# Patient Record
Sex: Male | Born: 1935
Health system: Southern US, Community
[De-identification: ages and names within clinical notes are randomized; demographics above are authoritative.]

## PROBLEM LIST (undated history)

## (undated) DIAGNOSIS — N400 Enlarged prostate without lower urinary tract symptoms: Secondary | ICD-10-CM

## (undated) DIAGNOSIS — I10 Essential (primary) hypertension: Secondary | ICD-10-CM

## (undated) DIAGNOSIS — G629 Polyneuropathy, unspecified: Secondary | ICD-10-CM

## (undated) DIAGNOSIS — K5790 Diverticulosis of intestine, part unspecified, without perforation or abscess without bleeding: Secondary | ICD-10-CM

## (undated) DIAGNOSIS — E785 Hyperlipidemia, unspecified: Secondary | ICD-10-CM

## (undated) DIAGNOSIS — M1990 Unspecified osteoarthritis, unspecified site: Secondary | ICD-10-CM

## (undated) HISTORY — PX: MELANOMA EXCISION: SHX5266

## (undated) HISTORY — PX: OTHER SURGICAL HISTORY: SHX169

## (undated) HISTORY — PX: CARDIAC CATHETERIZATION: SHX172

## (undated) HISTORY — PX: HERNIA REPAIR: SHX51

## (undated) HISTORY — PX: COLON SURGERY: SHX602

## (undated) HISTORY — PX: TONSILLECTOMY: SUR1361

---

## 2004-02-15 ENCOUNTER — Ambulatory Visit: Payer: Self-pay | Admitting: Gastroenterology

## 2004-02-24 ENCOUNTER — Ambulatory Visit: Payer: Self-pay | Admitting: Gastroenterology

## 2004-03-13 ENCOUNTER — Ambulatory Visit: Payer: Self-pay | Admitting: Urology

## 2007-02-21 ENCOUNTER — Emergency Department: Payer: Self-pay | Admitting: Urology

## 2010-11-23 ENCOUNTER — Ambulatory Visit: Payer: Self-pay | Admitting: Neurosurgery

## 2011-06-27 ENCOUNTER — Other Ambulatory Visit: Payer: Self-pay | Admitting: Neurosurgery

## 2011-07-17 ENCOUNTER — Encounter (HOSPITAL_COMMUNITY): Payer: Self-pay | Admitting: Pharmacy Technician

## 2011-07-20 NOTE — Pre-Procedure Instructions (Signed)
20 Mark Cowan  07/20/2011   Your procedure is scheduled on:  July 26, 2011 at 0730 am.  Report to [name] at 0530 AM.  Call this number if you have problems the morning of surgery: [phone]   Remember:   Do not eat food:After Midnight.  May have clear liquids: up to 4 Hours before arrival until 0130 am  Clear liquids include soda, tea, black coffee, apple or grape juice, broth.  Take these medicines the morning of surgery with A SIP OF WATER: Gabapentin (Neurontin)   Do not wear jewelry, make-up or nail polish.  Do not wear lotions, powders, or perfumes. You may wear deodorant.  Do not shave 48 hours prior to surgery.  Do not bring valuables to the hospital.  Contacts, dentures or bridgework may not be worn into surgery.  Leave suitcase in the car. After surgery it may be brought to your room.  For patients admitted to the hospital, checkout time is 11:00 AM the day of discharge.   Patients discharged the day of surgery will not be allowed to drive home.  Name and phone number of your driver:   Special Instructions: CHG Shower Use Special Wash: 1/2 bottle night before surgery and 1/2 bottle morning of surgery.   Please read over the following fact sheets that you were given: Pain Booklet, Coughing and Deep Breathing, MRSA Information and Surgical Site Infection Prevention

## 2011-07-23 ENCOUNTER — Encounter (HOSPITAL_COMMUNITY)
Admission: RE | Admit: 2011-07-23 | Discharge: 2011-07-23 | Disposition: A | Discharge status: Home / Self Care | Payer: Medicare Other | Source: Ambulatory Visit | Attending: Anesthesiology | Admitting: Anesthesiology

## 2011-07-23 ENCOUNTER — Other Ambulatory Visit: Payer: Self-pay

## 2011-07-23 ENCOUNTER — Encounter (HOSPITAL_COMMUNITY)
Admission: RE | Admit: 2011-07-23 | Discharge: 2011-07-23 | Disposition: A | Discharge status: Home / Self Care | Payer: Medicare Other | Source: Ambulatory Visit | Attending: Neurosurgery | Admitting: Neurosurgery

## 2011-07-23 ENCOUNTER — Encounter (HOSPITAL_COMMUNITY): Payer: Self-pay

## 2011-07-23 HISTORY — DX: Benign prostatic hyperplasia without lower urinary tract symptoms: N40.0

## 2011-07-23 HISTORY — DX: Polyneuropathy, unspecified: G62.9

## 2011-07-23 HISTORY — DX: Essential (primary) hypertension: I10

## 2011-07-23 HISTORY — DX: Unspecified osteoarthritis, unspecified site: M19.90

## 2011-07-23 LAB — SURGICAL PCR SCREEN
MRSA, PCR: NEGATIVE
Staphylococcus aureus: NEGATIVE

## 2011-07-23 LAB — BASIC METABOLIC PANEL
GFR calc Af Amer: >90 mL/min (ref >90)
GFR calc non Af Amer: 82 mL/min — ABNORMAL LOW (ref >90)
Glucose, Bld: 104 mg/dL — ABNORMAL HIGH (ref 70–99)
Potassium: 4 mEq/L (ref 3.5–5.1)
Sodium: 137 mEq/L (ref 135–145)

## 2011-07-23 LAB — CBC
Hemoglobin: 14.5 g/dL (ref 13.0–17.0)
MCHC: 34.3 g/dL (ref 30.0–36.0)
Platelets: 209 10*3/uL (ref 150–400)

## 2011-07-25 MED ORDER — CEFAZOLIN SODIUM-DEXTROSE 2-3 GM-% IV SOLR
2.0000 g | INTRAVENOUS | Status: AC
  Administered 2011-07-26: 2 g via INTRAVENOUS
  Filled 2011-07-25: qty 50

## 2011-07-26 ENCOUNTER — Encounter (HOSPITAL_COMMUNITY)
Admission: RE | Disposition: A | Discharge status: Home / Self Care | Payer: Self-pay | Source: Ambulatory Visit | Attending: Neurosurgery

## 2011-07-26 ENCOUNTER — Inpatient Hospital Stay (HOSPITAL_COMMUNITY)
Admission: RE | Admit: 2011-07-26 | Discharge: 2011-07-27 | DRG: 491 | Disposition: A | Discharge status: Home / Self Care | Payer: Medicare Other | Source: Ambulatory Visit | Attending: Neurosurgery | Admitting: Neurosurgery

## 2011-07-26 ENCOUNTER — Ambulatory Visit (HOSPITAL_COMMUNITY): Payer: Medicare Other

## 2011-07-26 ENCOUNTER — Encounter (HOSPITAL_COMMUNITY): Payer: Self-pay | Admitting: Surgery

## 2011-07-26 ENCOUNTER — Encounter (HOSPITAL_COMMUNITY): Payer: Self-pay | Admitting: Anesthesiology

## 2011-07-26 ENCOUNTER — Ambulatory Visit (HOSPITAL_COMMUNITY): Payer: Medicare Other | Admitting: Anesthesiology

## 2011-07-26 DIAGNOSIS — G609 Hereditary and idiopathic neuropathy, unspecified: Secondary | ICD-10-CM | POA: Diagnosis present

## 2011-07-26 DIAGNOSIS — Z85820 Personal history of malignant melanoma of skin: Secondary | ICD-10-CM

## 2011-07-26 DIAGNOSIS — Z79899 Other long term (current) drug therapy: Secondary | ICD-10-CM

## 2011-07-26 DIAGNOSIS — M4120 Other idiopathic scoliosis, site unspecified: Secondary | ICD-10-CM | POA: Diagnosis present

## 2011-07-26 DIAGNOSIS — M129 Arthropathy, unspecified: Secondary | ICD-10-CM | POA: Diagnosis present

## 2011-07-26 DIAGNOSIS — Z7982 Long term (current) use of aspirin: Secondary | ICD-10-CM

## 2011-07-26 DIAGNOSIS — Z87891 Personal history of nicotine dependence: Secondary | ICD-10-CM

## 2011-07-26 DIAGNOSIS — I10 Essential (primary) hypertension: Secondary | ICD-10-CM | POA: Diagnosis present

## 2011-07-26 DIAGNOSIS — M48062 Spinal stenosis, lumbar region with neurogenic claudication: Principal | ICD-10-CM | POA: Diagnosis present

## 2011-07-26 DIAGNOSIS — Z9861 Coronary angioplasty status: Secondary | ICD-10-CM

## 2011-07-26 DIAGNOSIS — IMO0002 Reserved for concepts with insufficient information to code with codable children: Secondary | ICD-10-CM | POA: Diagnosis present

## 2011-07-26 HISTORY — DX: Diverticulosis of intestine, part unspecified, without perforation or abscess without bleeding: K57.90

## 2011-07-26 HISTORY — PX: LUMBAR LAMINECTOMY/DECOMPRESSION MICRODISCECTOMY: SHX5026

## 2011-07-26 SURGERY — LUMBAR LAMINECTOMY/DECOMPRESSION MICRODISCECTOMY 2 LEVELS
Anesthesia: General | Site: Back | Laterality: Bilateral | Wound class: Clean

## 2011-07-26 MED ORDER — FENTANYL CITRATE 0.05 MG/ML IJ SOLN
INTRAMUSCULAR | Status: DC | PRN
  Administered 2011-07-26: 50 ug via INTRAVENOUS
  Administered 2011-07-26: 100 ug via INTRAVENOUS
  Administered 2011-07-26 (×2): 50 ug via INTRAVENOUS

## 2011-07-26 MED ORDER — HYDROMORPHONE HCL PF 1 MG/ML IJ SOLN: 0.2500 mg | INTRAMUSCULAR | Status: DC | PRN

## 2011-07-26 MED ORDER — MORPHINE SULFATE 4 MG/ML IJ SOLN: 1.0000 mg | INTRAMUSCULAR | Status: DC | PRN

## 2011-07-26 MED ORDER — ONDANSETRON HCL 4 MG/2ML IJ SOLN
INTRAMUSCULAR | Status: DC | PRN
  Administered 2011-07-26: 4 mg via INTRAVENOUS

## 2011-07-26 MED ORDER — PROPOFOL 10 MG/ML IV EMUL
INTRAVENOUS | Status: DC | PRN
  Administered 2011-07-26: 150 mg via INTRAVENOUS

## 2011-07-26 MED ORDER — FENOFIBRATE 160 MG PO TABS
160.0000 mg | ORAL_TABLET | Freq: Every day | ORAL | Status: DC
  Administered 2011-07-26: 160 mg via ORAL
  Filled 2011-07-26 (×2): qty 1

## 2011-07-26 MED ORDER — GABAPENTIN 300 MG PO CAPS
300.0000 mg | ORAL_CAPSULE | Freq: Three times a day (TID) | ORAL | Status: DC
  Administered 2011-07-26 – 2011-07-27 (×2): 300 mg via ORAL
  Filled 2011-07-26 (×5): qty 1

## 2011-07-26 MED ORDER — ROCURONIUM BROMIDE 100 MG/10ML IV SOLN
INTRAVENOUS | Status: DC | PRN
  Administered 2011-07-26: 50 mg via INTRAVENOUS

## 2011-07-26 MED ORDER — LOSARTAN POTASSIUM-HCTZ 100-12.5 MG PO TABS
1.0000 | ORAL_TABLET | Freq: Every day | ORAL | Status: DC
Start: 2011-07-26 — End: 2011-07-26

## 2011-07-26 MED ORDER — BACITRACIN 50000 UNITS IM SOLR
INTRAMUSCULAR | Status: AC
  Filled 2011-07-26: qty 1

## 2011-07-26 MED ORDER — MENTHOL 3 MG MT LOZG: 1.0000 | LOZENGE | OROMUCOSAL | Status: DC | PRN

## 2011-07-26 MED ORDER — HEMOSTATIC AGENTS (NO CHARGE) OPTIME
TOPICAL | Status: DC | PRN
  Administered 2011-07-26: 1 via TOPICAL

## 2011-07-26 MED ORDER — PHENOL 1.4 % MT LIQD: 1.0000 | OROMUCOSAL | Status: DC | PRN

## 2011-07-26 MED ORDER — LOSARTAN POTASSIUM 50 MG PO TABS
100.0000 mg | ORAL_TABLET | Freq: Every day | ORAL | Status: DC
  Administered 2011-07-26 – 2011-07-27 (×2): 100 mg via ORAL
  Filled 2011-07-26 (×2): qty 2

## 2011-07-26 MED ORDER — HYDROCHLOROTHIAZIDE 12.5 MG PO CAPS
12.5000 mg | ORAL_CAPSULE | Freq: Every day | ORAL | Status: DC
  Administered 2011-07-26 – 2011-07-27 (×2): 12.5 mg via ORAL
  Filled 2011-07-26 (×2): qty 1

## 2011-07-26 MED ORDER — OXYCODONE-ACETAMINOPHEN 5-325 MG PO TABS
1.0000 | ORAL_TABLET | ORAL | Status: DC | PRN
  Administered 2011-07-27 (×2): 1 via ORAL
  Filled 2011-07-26 (×2): qty 1

## 2011-07-26 MED ORDER — ZOLPIDEM TARTRATE 5 MG PO TABS: 5.0000 mg | ORAL_TABLET | Freq: Every evening | ORAL | Status: DC | PRN

## 2011-07-26 MED ORDER — TAMSULOSIN HCL 0.4 MG PO CAPS
0.4000 mg | ORAL_CAPSULE | Freq: Every day | ORAL | Status: DC
  Administered 2011-07-26: 0.4 mg via ORAL
  Filled 2011-07-26 (×2): qty 1

## 2011-07-26 MED ORDER — BACITRACIN ZINC 500 UNIT/GM EX OINT
TOPICAL_OINTMENT | CUTANEOUS | Status: DC | PRN
  Administered 2011-07-26: 1 via TOPICAL

## 2011-07-26 MED ORDER — DEXAMETHASONE SODIUM PHOSPHATE 4 MG/ML IJ SOLN
INTRAMUSCULAR | Status: DC | PRN
  Administered 2011-07-26: 8 mg via INTRAVENOUS

## 2011-07-26 MED ORDER — 0.9 % SODIUM CHLORIDE (POUR BTL) OPTIME
TOPICAL | Status: DC | PRN
  Administered 2011-07-26: 1000 mL

## 2011-07-26 MED ORDER — ACETAMINOPHEN 650 MG RE SUPP: 650.0000 mg | RECTAL | Status: DC | PRN

## 2011-07-26 MED ORDER — BUPIVACAINE-EPINEPHRINE PF 0.5-1:200000 % IJ SOLN
INTRAMUSCULAR | Status: DC | PRN
  Administered 2011-07-26: 10 mL

## 2011-07-26 MED ORDER — LACTATED RINGERS IV SOLN
INTRAVENOUS | Status: DC | PRN
  Administered 2011-07-26 (×2): via INTRAVENOUS

## 2011-07-26 MED ORDER — ACETAMINOPHEN 325 MG PO TABS
650.0000 mg | ORAL_TABLET | ORAL | Status: DC | PRN
Start: 2011-07-26 — End: 2011-07-27

## 2011-07-26 MED ORDER — DUTASTERIDE 0.5 MG PO CAPS
0.5000 mg | ORAL_CAPSULE | Freq: Every day | ORAL | Status: DC
  Administered 2011-07-26 – 2011-07-27 (×2): 0.5 mg via ORAL
  Filled 2011-07-26 (×2): qty 1

## 2011-07-26 MED ORDER — LIDOCAINE HCL (CARDIAC) 20 MG/ML IV SOLN
INTRAVENOUS | Status: DC | PRN
  Administered 2011-07-26: 100 mg via INTRAVENOUS

## 2011-07-26 MED ORDER — THROMBIN 5000 UNITS EX KIT
PACK | CUTANEOUS | Status: DC | PRN
  Administered 2011-07-26 (×2): 5000 [IU] via TOPICAL

## 2011-07-26 MED ORDER — BUPIVACAINE LIPOSOME 1.3 % IJ SUSP
20.0000 mL | Freq: Once | INTRAMUSCULAR | Status: DC
  Filled 2011-07-26: qty 20

## 2011-07-26 MED ORDER — GLYCOPYRROLATE 0.2 MG/ML IJ SOLN
INTRAMUSCULAR | Status: DC | PRN
  Administered 2011-07-26: .5 mg via INTRAVENOUS

## 2011-07-26 MED ORDER — HYDROCODONE-ACETAMINOPHEN 5-325 MG PO TABS: 1.0000 | ORAL_TABLET | ORAL | Status: DC | PRN

## 2011-07-26 MED ORDER — NEOSTIGMINE METHYLSULFATE 1 MG/ML IJ SOLN
INTRAMUSCULAR | Status: DC | PRN
  Administered 2011-07-26: 4 mg via INTRAVENOUS

## 2011-07-26 MED ORDER — ADULT MULTIVITAMIN W/MINERALS CH
1.0000 | ORAL_TABLET | Freq: Every day | ORAL | Status: DC
  Administered 2011-07-26 – 2011-07-27 (×2): 1 via ORAL
  Filled 2011-07-26 (×2): qty 1

## 2011-07-26 MED ORDER — SUCCINYLCHOLINE CHLORIDE 20 MG/ML IJ SOLN
INTRAMUSCULAR | Status: DC | PRN
  Administered 2011-07-26: 120 mg via INTRAVENOUS

## 2011-07-26 MED ORDER — MIDAZOLAM HCL 5 MG/5ML IJ SOLN
INTRAMUSCULAR | Status: DC | PRN
  Administered 2011-07-26: 2 mg via INTRAVENOUS

## 2011-07-26 MED ORDER — LACTATED RINGERS IV SOLN: INTRAVENOUS | Status: DC

## 2011-07-26 MED ORDER — DOCUSATE SODIUM 100 MG PO CAPS
100.0000 mg | ORAL_CAPSULE | Freq: Two times a day (BID) | ORAL | Status: DC
  Administered 2011-07-26 – 2011-07-27 (×2): 100 mg via ORAL
  Filled 2011-07-26 (×2): qty 1

## 2011-07-26 MED ORDER — SODIUM CHLORIDE 0.9 % IR SOLN
Status: DC | PRN
  Administered 2011-07-26: 08:00:00

## 2011-07-26 MED ORDER — DIAZEPAM 5 MG PO TABS: 5.0000 mg | ORAL_TABLET | Freq: Four times a day (QID) | ORAL | Status: DC | PRN

## 2011-07-26 MED ORDER — CEFAZOLIN SODIUM-DEXTROSE 2-3 GM-% IV SOLR
2.0000 g | Freq: Three times a day (TID) | INTRAVENOUS | Status: AC
  Administered 2011-07-26 (×2): 2 g via INTRAVENOUS
  Filled 2011-07-26 (×2): qty 50

## 2011-07-26 MED ORDER — BUPIVACAINE LIPOSOME 1.3 % IJ SUSP
INTRAMUSCULAR | Status: DC | PRN
  Administered 2011-07-26: 20 mL

## 2011-07-26 MED ORDER — ONDANSETRON HCL 4 MG/2ML IJ SOLN: 4.0000 mg | INTRAMUSCULAR | Status: DC | PRN

## 2011-07-26 MED ORDER — SODIUM CHLORIDE 0.9 % IV SOLN
INTRAVENOUS | Status: AC
  Filled 2011-07-26: qty 500

## 2011-07-26 SURGICAL SUPPLY — 60 items
APL SKNCLS STERI-STRIP NONHPOA (GAUZE/BANDAGES/DRESSINGS) ×1
BAG DECANTER FOR FLEXI CONT (MISCELLANEOUS) ×2 IMPLANT
BENZOIN TINCTURE PRP APPL 2/3 (GAUZE/BANDAGES/DRESSINGS) ×2 IMPLANT
BLADE SURG ROTATE 9660 (MISCELLANEOUS) ×1 IMPLANT
BRUSH SCRUB EZ PLAIN DRY (MISCELLANEOUS) ×2 IMPLANT
BUR ACORN 6.0 (BURR) ×3 IMPLANT
BUR MATCHSTICK NEURO 3.0 LAGG (BURR) ×2 IMPLANT
CANISTER SUCTION 2500CC (MISCELLANEOUS) ×2 IMPLANT
CLOTH BEACON ORANGE TIMEOUT ST (SAFETY) ×2 IMPLANT
CONT SPEC 4OZ CLIKSEAL STRL BL (MISCELLANEOUS) ×2 IMPLANT
DRAPE LAPAROTOMY 100X72X124 (DRAPES) ×2 IMPLANT
DRAPE MICROSCOPE LEICA (MISCELLANEOUS) ×1 IMPLANT
DRAPE MICROSCOPE ZEISS OPMI (DRAPES) ×1 IMPLANT
DRAPE POUCH INSTRU U-SHP 10X18 (DRAPES) ×2 IMPLANT
DRAPE SURG 17X23 STRL (DRAPES) ×8 IMPLANT
ELECT BLADE 4.0 EZ CLEAN MEGAD (MISCELLANEOUS) ×2
ELECT REM PT RETURN 9FT ADLT (ELECTROSURGICAL) ×2
ELECTRODE BLDE 4.0 EZ CLN MEGD (MISCELLANEOUS) ×1 IMPLANT
ELECTRODE REM PT RTRN 9FT ADLT (ELECTROSURGICAL) ×1 IMPLANT
GAUZE SPONGE 4X4 16PLY XRAY LF (GAUZE/BANDAGES/DRESSINGS) ×1 IMPLANT
GLOVE BIO SURGEON STRL SZ8 (GLOVE) ×1 IMPLANT
GLOVE BIO SURGEON STRL SZ8.5 (GLOVE) ×2 IMPLANT
GLOVE BIOGEL PI IND STRL 7.0 (GLOVE) IMPLANT
GLOVE BIOGEL PI INDICATOR 7.0 (GLOVE) ×3
GLOVE ECLIPSE 7.5 STRL STRAW (GLOVE) ×1 IMPLANT
GLOVE EXAM NITRILE LRG STRL (GLOVE) IMPLANT
GLOVE EXAM NITRILE MD LF STRL (GLOVE) ×1 IMPLANT
GLOVE EXAM NITRILE XL STR (GLOVE) IMPLANT
GLOVE EXAM NITRILE XS STR PU (GLOVE) IMPLANT
GLOVE SS BIOGEL STRL SZ 6.5 (GLOVE) IMPLANT
GLOVE SS BIOGEL STRL SZ 8 (GLOVE) ×1 IMPLANT
GLOVE SUPERSENSE BIOGEL SZ 6.5 (GLOVE) ×1
GLOVE SUPERSENSE BIOGEL SZ 8 (GLOVE) ×1
GLOVE SURG SS PI 6.5 STRL IVOR (GLOVE) ×2 IMPLANT
GLOVE SURG SS PI 7.0 STRL IVOR (GLOVE) ×2 IMPLANT
GOWN BRE IMP SLV AUR LG STRL (GOWN DISPOSABLE) ×1 IMPLANT
GOWN BRE IMP SLV AUR XL STRL (GOWN DISPOSABLE) ×5 IMPLANT
GOWN STRL REIN 2XL LVL4 (GOWN DISPOSABLE) IMPLANT
KIT BASIN OR (CUSTOM PROCEDURE TRAY) ×2 IMPLANT
KIT ROOM TURNOVER OR (KITS) ×2 IMPLANT
NDL HYPO 21X1.5 SAFETY (NEEDLE) IMPLANT
NEEDLE HYPO 21X1.5 SAFETY (NEEDLE) ×2 IMPLANT
NEEDLE HYPO 22GX1.5 SAFETY (NEEDLE) ×2 IMPLANT
NS IRRIG 1000ML POUR BTL (IV SOLUTION) ×2 IMPLANT
PACK LAMINECTOMY NEURO (CUSTOM PROCEDURE TRAY) ×2 IMPLANT
PAD ARMBOARD 7.5X6 YLW CONV (MISCELLANEOUS) ×6 IMPLANT
PATTIES SURGICAL .5 X1 (DISPOSABLE) IMPLANT
RUBBERBAND STERILE (MISCELLANEOUS) ×4 IMPLANT
SPONGE GAUZE 4X4 12PLY (GAUZE/BANDAGES/DRESSINGS) ×2 IMPLANT
SPONGE SURGIFOAM ABS GEL SZ50 (HEMOSTASIS) ×2 IMPLANT
STRIP CLOSURE SKIN 1/2X4 (GAUZE/BANDAGES/DRESSINGS) ×2 IMPLANT
SUT VIC AB 1 CT1 18XBRD ANBCTR (SUTURE) ×2 IMPLANT
SUT VIC AB 1 CT1 8-18 (SUTURE) ×4
SUT VIC AB 2-0 CP2 18 (SUTURE) ×4 IMPLANT
SYR 20CC LL (SYRINGE) ×1 IMPLANT
SYR 20ML ECCENTRIC (SYRINGE) ×2 IMPLANT
TAPE CLOTH SURG 4X10 WHT LF (GAUZE/BANDAGES/DRESSINGS) ×1 IMPLANT
TOWEL OR 17X24 6PK STRL BLUE (TOWEL DISPOSABLE) ×2 IMPLANT
TOWEL OR 17X26 10 PK STRL BLUE (TOWEL DISPOSABLE) ×2 IMPLANT
WATER STERILE IRR 1000ML POUR (IV SOLUTION) ×2 IMPLANT

## 2011-07-26 NOTE — Op Note (Signed)
Brief history: The patient is a 78 year old white male who complains of back and leg pain consistent with neurogenic claudication. He has failed medical management worked up with a lumbar MRI. This demonstrated patient has scoliosis, stenosis etc. I discussed the various treatment options with the patient, and surgery. He has weighed the risks, benefits, and alternatives surgery and elected proceed with a decompressive lumbar laminectomy.  Preoperative diagnosis: Lumbar scoliosis, L2-3, L3-4 and L4-5 spinal stenosis, lumbago, lumbar radiculopathy/neurogenic claudication  Postoperative diagnosis: The same  Procedure: L3 and L4 laminectomy with bilateral L2 laminotomies to decompress the bilateral L2, L3, L4 and L5 nerve roots using microdissection  Surgeon: Dr. Delma Officer  Asst.: Dr. Aliene Beams  Anesthesia: Gen. endotracheal  Estimated blood loss: 125 cc  Drains: None  Complications: None  Description of procedure: The patient was brought to the operating room by the anesthesia team. General endotracheal anesthesia was induced. The patient was turned to the prone position on the Wilson frame. The patient's lumbosacral region was then prepared with Betadine scrub and Betadine solution. Sterile drapes were applied.  I then injected the area to be incised with Marcaine with epinephrine solution. I then used a scalpel to make a linear midline incision over the L2-3, L3-4 and L4-5 intervertebral disc space. I then used electrocautery to perform a lateral  subperiosteal dissection exposing the spinous process and lamina of L2-L5. We obtained intraoperative radiograph to confirm our location. I then inserted the [facility] retractor for exposure.  We then brought the operative microscope into the field. Under its magnification and illumination we completed the microdissection. I used a high-speed drill to perform a laminotomy at L2-L4 bilaterally. I then used a Kerrison punches to complete the  laminectomy at L3 and L4 as well as widen the laminotomy at L2 and removed the ligamentum flavum at L2-3, L3-4 and L4-5. We then used microdissection to free up the thecal sac and the L2, L3, L4 and L5 nerve root from the epidural tissue. I then used a Kerrison punch to perform a foraminotomy at about the L2, L3, L4 and L5 nerve root. We then using the nerve root retractor to gently retract the thecal sac and the nerve root medially. This exposed the intervertebral disc. We inspected the intervertebral disc at L2-3, L3-4 and L4-5 bilaterally. There was a bulging but no significant herniations. We did not enter into the intervertebral disc.  I then palpated along the ventral surface of the thecal sac and along exit route of the bilateral L2, L3, L4 and L5 nerve root and noted that the neural structures were well decompressed. This completed the decompression.  We then obtained hemostasis using bipolar electrocautery. We irrigated the wound out with bacitracin solution. We then removed the retractor. We then reapproximated the patient's thoracolumbar fascia with interrupted #1 Vicryl suture. We then reapproximated the patient's subcutaneous tissue with interrupted 3-0 Vicryl suture. We then reapproximated patient's skin with Steri-Strips and benzoin. The was then coated with bacitracin ointment. The drapes were removed. The patient was subsequently returned to the supine position where they were extubated by the anesthesia team. The patient was then transported to the postanesthesia care unit in stable condition. All sponge instrument and needle counts were correct at the end of this case.

## 2011-07-26 NOTE — Anesthesia Procedure Notes (Signed)
Procedure Name: Intubation Date/Time: 07/26/2011 7:45 AM Performed by: Torris House S Pre-anesthesia Checklist: Patient identified, Emergency Drugs available, Suction available, Patient being monitored and Timeout performed Patient Re-evaluated:Patient Re-evaluated prior to inductionOxygen Delivery Method: Circle system utilized Preoxygenation: Pre-oxygenation with 100% oxygen Intubation Type: IV induction Ventilation: Mask ventilation without difficulty Laryngoscope Size: Mac and 4 Grade View: Grade II Tube type: Oral Tube size: 7.5 mm Number of attempts: 2 Airway Equipment and Method: Stylet Placement Confirmation: ETT inserted through vocal cords under direct vision,  positive ETCO2 and breath sounds checked- equal and bilateral Secured at: 23 cm Tube secured with: Tape Dental Injury: Teeth and Oropharynx as per pre-operative assessment

## 2011-07-26 NOTE — Preoperative (Signed)
Beta Blockers   Reason not to administer Beta Blockers:Not Applicable 

## 2011-07-26 NOTE — Transfer of Care (Signed)
Immediate Anesthesia Transfer of Care Note  Patient: Mark Cowan  Procedure(s) Performed: Procedure(s) (LRB): LUMBAR LAMINECTOMY/DECOMPRESSION MICRODISCECTOMY 2 LEVELS (Bilateral)  Patient Location: PACU  Anesthesia Type: General  Level of Consciousness: awake, alert  and oriented  Airway & Oxygen Therapy: Patient Spontanous Breathing and Patient connected to nasal cannula oxygen  Post-op Assessment: Report given to PACU RN and Post -op Vital signs reviewed and stable  Post vital signs: Reviewed and stable  Complications: No apparent anesthesia complications

## 2011-07-26 NOTE — Anesthesia Postprocedure Evaluation (Signed)
  Anesthesia Post-op Note  Patient: Mark Cowan  Procedure(s) Performed: Procedure(s) (LRB): LUMBAR LAMINECTOMY/DECOMPRESSION MICRODISCECTOMY 2 LEVELS (Bilateral)  Patient Location: PACU  Anesthesia Type: General  Level of Consciousness: awake  Airway and Oxygen Therapy: Patient Spontanous Breathing and Patient connected to nasal cannula oxygen  Post-op Pain: none  Post-op Assessment: Post-op Vital signs reviewed, Patient's Cardiovascular Status Stable, Respiratory Function Stable and Patent Airway  Post-op Vital Signs: Reviewed and stable  Complications: No apparent anesthesia complications

## 2011-07-26 NOTE — Progress Notes (Signed)
Subjective:  The patient is alert and pleasant he looks well. He is in no apparent distress.  Objective: Vital signs in last 24 hours: Temp:  [97.2 F (36.2 C)-97.9 F (36.6 C)] 97.2 F (36.2 C) (04/11 1003) Pulse Rate:  [50-71] 51  (04/11 1037) Resp:  [12-18] 14  (04/11 1037) BP: (113-141)/(58-70) 120/70 mmHg (04/11 1034) SpO2:  [92 %-98 %] 95 % (04/11 1037)  Intake/Output from previous day:   Intake/Output this shift: Total I/O In: 1700 [I.V.:1700] Out: 200 [Blood:200]  Physical exam the patient is alert and oriented. His lower extremity motor strength is grossly normal.  Lab Results: No results found for this basename: WBC:2,HGB:2,HCT:2,PLT:2 in the last 72 hours BMET No results found for this basename: NA:2,K:2,CL:2,CO2:2,GLUCOSE:2,BUN:2,CREATININE:2,CALCIUM:2 in the last 72 hours  Studies/Results: Dg Lumbar Spine 1 View  07/26/2011  *RADIOLOGY REPORT*  Clinical Data: L2-L4 laminectomy, decompression.  LUMBAR SPINE - 1 VIEW  Comparison: 10/24/2010  Findings: Single intraoperative image demonstrates posterior surgical instrument directed at the L4-5 level.  IMPRESSION: Intraoperative localization as above.  Original Report Authenticated By: Cyndie Chime, M.D.    Assessment/Plan: The patient is doing well.  LOS: 0 days     Mark Cowan D 07/26/2011, 10:50 AM

## 2011-07-26 NOTE — H&P (Signed)
Subjective: The patient is a 78 year old white male who has complained of back and left leg pain consistent with neurogenic claudication. He has failed medical management and was worked up with a lumbar MRI. This demonstrated the patient has multilevel lumbar spinal stenosis. I discussed the various treatment options with the patient. The patient has decided proceed with a lumbar laminectomy after weighing the risks, benefits and alternatives.   Past Medical History  Diagnosis Date  . Hypertension     dr  Rosanne Ashing   [place]    [place]  . Arthritis   . Prostate enlargement   . Peripheral neuropathy   . Diverticulosis     last attack 04/2011    Past Surgical History  Procedure Date  . Cardiac catheterization     87     stress test   87  . Melanoma excision     lt arm  . Hernia repair   . Colon surgery     part of colon removed,diseased  . Orthrocsopy  lt knee     No Known Allergies  History  Substance Use Topics  . Smoking status: Former Smoker    Quit date: 07/23/1998  . Smokeless tobacco: Not on file  . Alcohol Use: Yes     socially    History reviewed. No pertinent family history. Prior to Admission medications   Medication Sig Start Date End Date Taking? Authorizing Provider  aspirin EC 81 MG tablet Take 81 mg by mouth daily.   Yes Historical Provider, MD  Cholecalciferol (VITAMIN D3) 1000 UNITS CAPS Take 2,000 Units by mouth daily.   Yes Historical Provider, MD  Coenzyme Q10 (CO Q 10) 100 MG CAPS Take 100 mg by mouth daily.   Yes Historical Provider, MD  Docusate Calcium (STOOL SOFTENER PO) Take 100 mg by mouth daily.   Yes Historical Provider, MD  dutasteride (AVODART) 0.5 MG capsule Take 0.5 mg by mouth daily.   Yes Historical Provider, MD  fenofibrate 160 MG tablet Take 160 mg by mouth daily.   Yes Historical Provider, MD  fish oil-omega-3 fatty acids 1000 MG capsule Take 1 g by mouth 2 (two) times daily.   Yes Historical Provider, MD  gabapentin (NEURONTIN) 300 MG  capsule Take 300 mg by mouth 3 (three) times daily.   Yes Historical Provider, MD  Glucosamine-Chondroit-Vit C-Mn (GLUCOSAMINE 1500 COMPLEX PO) Take 1,500 mg by mouth 2 (two) times daily.   Yes Historical Provider, MD  losartan-hydrochlorothiazide (HYZAAR) 100-12.5 MG per tablet Take 1 tablet by mouth daily.   Yes Historical Provider, MD  Multiple Vitamin (MULITIVITAMIN WITH MINERALS) TABS Take 1 tablet by mouth daily.   Yes Historical Provider, MD  Tamsulosin HCl (FLOMAX) 0.4 MG CAPS Take 0.4 mg by mouth daily.   Yes Historical Provider, MD  vitamin C (ASCORBIC ACID) 500 MG tablet Take 500 mg by mouth daily.   Yes Historical Provider, MD     Review of Systems  Positive ROS: As above in the patient has bilateral foot numbness  All other systems have been reviewed and were otherwise negative with the exception of those mentioned in the HPI and as above.  Objective: Vital signs in last 24 hours: Temp:  [97.9 F (36.6 C)] 97.9 F (36.6 C) (04/11 0612) Pulse Rate:  [52] 52  (04/11 0612) Resp:  [18] 18  (04/11 0612) BP: (113)/(65) 113/65 mmHg (04/11 0612) SpO2:  [92 %] 92 % (04/11 0612)  General Appearance: Alert, cooperative, no distress, appears stated age Head:  Normocephalic, without obvious abnormality, atraumatic Eyes: PERRL, conjunctiva/corneas clear, EOM's intact, fundi benign, both eyes      Ears: Normal TM's and external ear canals, both ears Throat: Lips, mucosa, and tongue normal; teeth and gums normal Neck: Supple, symmetrical, trachea midline, no adenopathy; thyroid: No enlargement/tenderness/nodules; no carotid bruit or JVD Back: Symmetric, no curvature, ROM normal, no CVA tenderness Lungs: Clear to auscultation bilaterally, respirations unlabored Heart: Regular rate and rhythm, S1 and S2 normal, no murmur, rub or gallop Abdomen: Soft, non-tender, bowel sounds active all four quadrants, no masses, no organomegaly Extremities: Extremities normal, atraumatic, no cyanosis or  edema Pulses: 2+ and symmetric all extremities Skin: Skin color, texture, turgor normal, no rashes or lesions  NEUROLOGIC:   Mental status: alert and oriented, no aphasia, good attention span, Fund of knowledge/ memory ok Motor Exam - grossly normal Sensory Exam - grossly normal Reflexes:  Coordination - grossly normal Gait - grossly normal Balance - grossly normal Cranial Nerves: I: smell Not tested  II: visual acuity  OS: Normal    OD: Normal   II: visual fields Full to confrontation  II: pupils Equal, round, reactive to light  III,VII: ptosis None  III,IV,VI: extraocular muscles  Full ROM  V: mastication Normal  V: facial light touch sensation  Normal  V,VII: corneal reflex  Present  VII: facial muscle function - upper  Normal  VII: facial muscle function - lower Normal  VIII: hearing Not tested  IX: soft palate elevation  Normal  IX,X: gag reflex Present  XI: trapezius strength  5/5  XI: sternocleidomastoid strength 5/5  XI: neck flexion strength  5/5  XII: tongue strength  Normal    Data Review Lab Results  Component Value Date   WBC 5.4 07/23/2011   HGB 14.5 07/23/2011   HCT 42.3 07/23/2011   MCV 87.0 07/23/2011   PLT 209 07/23/2011   Lab Results  Component Value Date   NA 137 07/23/2011   K 4.0 07/23/2011   CL 102 07/23/2011   CO2 25 07/23/2011   BUN 19 07/23/2011   CREATININE 0.88 07/23/2011   GLUCOSE 104* 07/23/2011   No results found for this basename: INR, PROTIME    Assessment/Plan: L2-3, L3-4 and L4-5 spinal stenosis, lumbago, lumbar radiculopathy/neurogenic claudication: I discussed the situation with the patient and his wife. I have reviewed the MR scan with them and and out the abnormalities. We have discussed the various treatment options including surgery. I described the surgical option of a L3 and 4 laminectomy with bilateral L2 laminotomies. I've shown her surgical models. We have discussed the risks, benefits, alternatives and likelihood of achieving our goals  with surgery. I have answered all his questions . The patient t would like to proceed with the operation.   Miloh Alcocer D 07/26/2011 7:24 AM

## 2011-07-26 NOTE — Anesthesia Preprocedure Evaluation (Addendum)
Anesthesia Evaluation  Patient identified by MRN, date of birth, ID band Patient awake    Reviewed: Allergy & Precautions, H&P , NPO status , Patient's Chart, lab work & pertinent test results  Airway Mallampati: II TM Distance: >3 FB Neck ROM: Full    Dental No notable dental hx. (+) Teeth Intact   Pulmonary neg pulmonary ROS, former smoker breath sounds clear to auscultation  Pulmonary exam normal       Cardiovascular hypertension, On Medications and Pt. on medications Rhythm:Regular Rate:Normal     Neuro/Psych  Neuromuscular disease negative psych ROS   GI/Hepatic negative GI ROS, Neg liver ROS,   Endo/Other  negative endocrine ROS  Renal/GU negative Renal ROS  negative genitourinary   Musculoskeletal   Abdominal   Peds  Hematology negative hematology ROS (+)   Anesthesia Other Findings   Reproductive/Obstetrics negative OB ROS                         Anesthesia Physical Anesthesia Plan  ASA: II  Anesthesia Plan: General   Post-op Pain Management:    Induction: Intravenous  Airway Management Planned: Oral ETT  Additional Equipment:   Intra-op Plan:   Post-operative Plan: Extubation in OR  Informed Consent: I have reviewed the patients History and Physical, chart, labs and discussed the procedure including the risks, benefits and alternatives for the proposed anesthesia with the patient or authorized representative who has indicated his/her understanding and acceptance.   Dental advisory given  Plan Discussed with: CRNA, Anesthesiologist and Surgeon  Anesthesia Plan Comments:        Anesthesia Quick Evaluation

## 2011-07-27 ENCOUNTER — Encounter (HOSPITAL_COMMUNITY): Payer: Self-pay | Admitting: Neurosurgery

## 2011-07-27 MED ORDER — OXYCODONE-ACETAMINOPHEN 5-325 MG PO TABS: 1.0000 | ORAL_TABLET | ORAL | Status: AC | PRN

## 2011-07-27 MED ORDER — DIAZEPAM 5 MG PO TABS: 5.0000 mg | ORAL_TABLET | Freq: Four times a day (QID) | ORAL | Status: AC | PRN

## 2011-07-27 NOTE — Progress Notes (Signed)
OT Discharge Note  Patient is being discharged from OT services secondary to:    Goals met and no further therapy needs identified.  Please see latest Therapy Progress Note for current level of functioning and progress toward goals.  Progress and discharge plan and discussed with patient/caregiver and they    Ot order received this AM   Pt now with d/c OT orders and d/c note in chart   OT to sign off - Pt with no needs    Lucile Shutters   OTR/L Pager: [phone] Office: [phone] .

## 2011-07-27 NOTE — Progress Notes (Signed)
UR COMPLETED  

## 2011-07-27 NOTE — Discharge Summary (Signed)
Physician Discharge Summary  Patient ID: Mark Cowan MRN: [id number] DOB/AGE: [birth year] 78 y.o.  Admit date: 07/26/2011 Discharge date: 07/27/2011  Admission Diagnoses: Lumbar spinal stenosis, neurogenic claudication, lumbago, lumbar radiculopathy, scoliosis  Discharge Diagnoses: Same Active Problems:  * No active hospital problems. *    Discharged Condition: good  Hospital Course: I admitted the patient to [facility] on 07/26/2011. On that day I performed a L2-L4 laminectomy. The surgery went well. The patient's postoperative course was unremarkable and by postop day #1 he was requesting discharge to home. The patient was given oral and written discharge instructions. All his questions were answered.  Consults: None Significant Diagnostic Studies: None Treatments: L2-4 laminectomy Discharge Exam: Blood pressure 110/53, pulse 76, temperature 98.1 F (36.7 C), temperature source Oral, resp. rate 14, SpO2 93.00%. The patient is alert and oriented. His lower strumming motor strength is normal. His dressing is clean and dry.  Disposition: Home  Discharge Orders    Future Orders Please Complete By Expires   Diet - low sodium heart healthy      Increase activity slowly      Discharge instructions      Comments:   Call [phone] for a followup appointment.   Remove dressing in 48 hours      Call MD for:  temperature >100.4      Call MD for:  persistant nausea and vomiting      Call MD for:  severe uncontrolled pain      Call MD for:  redness, tenderness, or signs of infection (pain, swelling, redness, odor or green/yellow discharge around incision site)      Call MD for:  difficulty breathing, headache or visual disturbances      Call MD for:  hives      Call MD for:  persistant dizziness or light-headedness      Call MD for:  extreme fatigue        Medication List  As of 07/27/2011  7:59 AM   TAKE these medications         aspirin EC 81 MG tablet   Take 81  mg by mouth daily.      Co Q 10 100 MG Caps   Take 100 mg by mouth daily.      diazepam 5 MG tablet   Commonly known as: VALIUM   Take 1 tablet (5 mg total) by mouth every 6 (six) hours as needed.      dutasteride 0.5 MG capsule   Commonly known as: AVODART   Take 0.5 mg by mouth daily.      fenofibrate 160 MG tablet   Take 160 mg by mouth daily.      fish oil-omega-3 fatty acids 1000 MG capsule   Take 1 g by mouth 2 (two) times daily.      gabapentin 300 MG capsule   Commonly known as: NEURONTIN   Take 300 mg by mouth 3 (three) times daily.      GLUCOSAMINE 1500 COMPLEX PO   Take 1,500 mg by mouth 2 (two) times daily.      losartan-hydrochlorothiazide 100-12.5 MG per tablet   Commonly known as: HYZAAR   Take 1 tablet by mouth daily.      mulitivitamin with minerals Tabs   Take 1 tablet by mouth daily.      oxyCODONE-acetaminophen 5-325 MG per tablet   Commonly known as: PERCOCET   Take 1-2 tablets by mouth every 4 (four) hours as needed.  STOOL SOFTENER PO   Take 100 mg by mouth daily.      Tamsulosin HCl 0.4 MG Caps   Commonly known as: FLOMAX   Take 0.4 mg by mouth daily.      vitamin C 500 MG tablet   Commonly known as: ASCORBIC ACID   Take 500 mg by mouth daily.      Vitamin D3 1000 UNITS Caps   Take 2,000 Units by mouth daily.             SignedCristi Loron 07/27/2011, 7:59 AM

## 2012-11-03 ENCOUNTER — Ambulatory Visit (INDEPENDENT_AMBULATORY_CARE_PROVIDER_SITE_OTHER): Payer: Medicare Other | Admitting: Nurse Practitioner

## 2012-11-03 ENCOUNTER — Encounter: Payer: Self-pay | Admitting: Nurse Practitioner

## 2012-11-03 ENCOUNTER — Telehealth: Payer: Self-pay | Admitting: Internal Medicine

## 2012-11-03 VITALS — BP 108/66 | HR 68 | Ht 70.0 in | Wt 203.0 lb

## 2012-11-03 DIAGNOSIS — Z1211 Encounter for screening for malignant neoplasm of colon: Secondary | ICD-10-CM

## 2012-11-03 DIAGNOSIS — K5732 Diverticulitis of large intestine without perforation or abscess without bleeding: Secondary | ICD-10-CM

## 2012-11-03 MED ORDER — METRONIDAZOLE 500 MG PO TABS: 500.0000 mg | ORAL_TABLET | Freq: Three times a day (TID) | ORAL | Status: DC

## 2012-11-03 MED ORDER — MOVIPREP 100 G PO SOLR
1.0000 | Freq: Once | ORAL | Status: DC
Start: 2012-11-03 — End: 2012-12-23

## 2012-11-03 MED ORDER — CIPROFLOXACIN HCL 500 MG PO TABS: 500.0000 mg | ORAL_TABLET | Freq: Two times a day (BID) | ORAL | Status: DC

## 2012-11-03 MED ORDER — HYDROCODONE-ACETAMINOPHEN 5-325 MG PO TABS: 1.0000 | ORAL_TABLET | Freq: Four times a day (QID) | ORAL | Status: DC | PRN

## 2012-11-03 NOTE — Telephone Encounter (Signed)
Former patient of Dr. Corinda Gubler. Has not been seen in along time. Per patient's wife hx diverticulitis. Has had abdominal pain and bloating x 1 week. Denies diarrhea, fever , nausea or vomiting. Scheduled with Willette Cluster, NP today at 11:00 AM.

## 2012-11-03 NOTE — Progress Notes (Signed)
HPI :  Patient is a 79 year old male, formerly followed by Dr. Corinda Gubler, here for evaluation of abdominal pain. He gives a history of diverticulitis and is s/p colon resection for diverticultiis in the 1990's. Over the last few years he has had a least one recurrent episode of diverticulitis, otherwise he had been doing well until two months ago when he began  having problems with constipation. Typically patient has a normal daily BM with Metamucil but over the last couple of months he has had to strain to have a BM. Last week he developed left sided abdominal pain which eventually became more localized to mid lower abdomen. He has associated bloating as well. Patient found a few old cipro pills which he tools on Saturday and yesterday morning. While he did improve, symptoms came back Sunday afternoon (yesterday). No fevers. No nausea. No urinary symptoms. No rectal bleeding. His weight is stable.   Past Medical History  Diagnosis Date  . Hypertension     dr  Rosanne Ashing   [place]    [place]  . Arthritis   . Prostate enlargement   . Peripheral neuropathy   . Diverticulosis     last attack 04/2011    Family History  Problem Relation Age of Onset  . Diverticulosis Father     Multiple myloma   History  Substance Use Topics  . Smoking status: Former Smoker    Quit date: 07/23/1998  . Smokeless tobacco: Never Used  . Alcohol Use: Yes     Comment: socially   Current Outpatient Prescriptions  Medication Sig Dispense Refill  . aspirin EC 81 MG tablet Take 81 mg by mouth daily.      . Cholecalciferol (VITAMIN D3) 1000 UNITS CAPS Take 2,000 Units by mouth daily.      . Coenzyme Q10 (CO Q 10) 100 MG CAPS Take 100 mg by mouth daily.      Tery Sanfilippo Calcium (STOOL SOFTENER PO) Take 100 mg by mouth daily.      Marland Kitchen dutasteride (AVODART) 0.5 MG capsule Take 0.5 mg by mouth daily.      . fish oil-omega-3 fatty acids 1000 MG capsule Take 1 g by mouth 2 (two) times daily.      Marland Kitchen gabapentin (NEURONTIN) 300 MG  capsule Take 300 mg by mouth 3 (three) times daily.      . Glucosamine-Chondroit-Vit C-Mn (GLUCOSAMINE 1500 COMPLEX PO) Take 1,500 mg by mouth 2 (two) times daily.      Marland Kitchen losartan-hydrochlorothiazide (HYZAAR) 100-12.5 MG per tablet Take 1 tablet by mouth daily.      . Multiple Vitamin (MULITIVITAMIN WITH MINERALS) TABS Take 1 tablet by mouth daily.      . Tamsulosin HCl (FLOMAX) 0.4 MG CAPS Take 0.4 mg by mouth daily.      . vitamin C (ASCORBIC ACID) 500 MG tablet Take 500 mg by mouth daily.      . ciprofloxacin (CIPRO) 500 MG tablet Take 1 tablet (500 mg total) by mouth 2 (two) times daily.  20 tablet  0  . HYDROcodone-acetaminophen (NORCO/VICODIN) 5-325 MG per tablet Take 1 tablet by mouth every 6 (six) hours as needed for pain.  30 tablet  0  . metroNIDAZOLE (FLAGYL) 500 MG tablet Take 1 tablet (500 mg total) by mouth 3 (three) times daily.  30 tablet  0  . MOVIPREP 100 G SOLR Take 1 kit (100 g total) by mouth once.  1 kit  0   No current facility-administered medications for  this visit.   No Known Allergies   Review of Systems:  Positive for arthritis. All other systems reviewed and negative except where noted in HPI.   Physical Exam: BP 108/66  Pulse 68  Ht 5\' 10"  (1.778 m)  Wt 203 lb (92.08 kg)  BMI 29.13 kg/m2 Constitutional: Pleasant,well-developed, white male in no acute distress. HEENT: Normocephalic and atraumatic. Conjunctivae are normal. No scleral icterus. Neck supple.  Cardiovascular: Normal rate, regular rhythm.  Pulmonary/chest: Effort normal and breath sounds normal. No wheezing, rales or rhonchi. Abdominal: Soft, nondistended, mild left mid abdominal tendereness. Bowel sounds active throughout. There are no masses palpable. No hepatomegaly. Extremities: no edema Lymphadenopathy: No cervical adenopathy noted. Neurological: Alert and oriented to person place and time. Skin: Skin is warm and dry. No rashes noted. Psychiatric: Normal mood and affect. Behavior is  normal.   ASSESSMENT AND PLAN:  83. 79 year old male with lower abdominal pain and history of diverticulitis for which he is status post resection in the 1990s. He is tender in the left mid abdomen, suspect recurrent diverticulitis. Will treat empirically with Cipro and Flagyl. Patient will call after completion of antibiotics if pain has not totally resolved. He will call sooner for worsening pain or fever.  2.  Colon cancer screening. Patient's last colonoscopy was November 2005. He is not do due for a colonoscopy until 2015 but in light of recent bowel changes we will proceed with colonoscopy earlier. Colonoscopy will be scheduled for approximately 3-4 weeks to allow time for resolution of diverticulitis. The risks, benefits, and alternatives to colonoscopy with possible biopsy and possible polypectomy were discussed with the patient and he consents to proceed. Since Dr. Macon Large has retired patient will need a new gastroenterologist in this practice. His wife sees Dr. Juanda Chance and patient prefers to see her as well

## 2012-11-03 NOTE — Patient Instructions (Addendum)
You have been scheduled for a colonoscopy with propofol. Please follow written instructions given to you at your visit today.  Please pick up your prep kit at the pharmacy within the next 1-3 days. If you use inhalers (even only as needed), please bring them with you on the day of your procedure. Your physician has requested that you go to www.startemmi.com and enter the access code given to you at your visit today. This web site gives a general overview about your procedure. However, you should still follow specific instructions given to you by our office regarding your preparation for the procedure. We have sent the following medications to your pharmacy for you to pick up at your convenience: .dottiem Please call after your complete the antibiotics and let us know if you are completely better.  [phone]. CC:  Jerl Mina MD

## 2012-11-04 DIAGNOSIS — K5732 Diverticulitis of large intestine without perforation or abscess without bleeding: Secondary | ICD-10-CM | POA: Insufficient documentation

## 2012-11-04 DIAGNOSIS — Z1211 Encounter for screening for malignant neoplasm of colon: Secondary | ICD-10-CM | POA: Insufficient documentation

## 2012-11-04 NOTE — Progress Notes (Signed)
Reviewed, I will see him.

## 2012-11-17 ENCOUNTER — Encounter: Payer: Self-pay | Admitting: Gastroenterology

## 2012-12-23 ENCOUNTER — Encounter: Payer: Self-pay | Admitting: Internal Medicine

## 2012-12-23 ENCOUNTER — Ambulatory Visit (AMBULATORY_SURGERY_CENTER): Payer: Medicare Other | Admitting: Internal Medicine

## 2012-12-23 VITALS — BP 102/55 | HR 51 | Temp 97.9°F | Resp 26 | Ht 70.0 in | Wt 203.0 lb

## 2012-12-23 DIAGNOSIS — Z1211 Encounter for screening for malignant neoplasm of colon: Secondary | ICD-10-CM

## 2012-12-23 DIAGNOSIS — K5792 Diverticulitis of intestine, part unspecified, without perforation or abscess without bleeding: Secondary | ICD-10-CM

## 2012-12-23 DIAGNOSIS — K5732 Diverticulitis of large intestine without perforation or abscess without bleeding: Secondary | ICD-10-CM

## 2012-12-23 MED ORDER — CIPROFLOXACIN HCL 500 MG PO TABS: 500.0000 mg | ORAL_TABLET | Freq: Two times a day (BID) | ORAL | Status: DC

## 2012-12-23 MED ORDER — SODIUM CHLORIDE 0.9 % IV SOLN: 500.0000 mL | INTRAVENOUS | Status: DC

## 2012-12-23 NOTE — Progress Notes (Signed)
Lidocaine-40mg IV prior to Propofol InductionPropofol given over incremental dosages 

## 2012-12-23 NOTE — Patient Instructions (Addendum)

## 2012-12-23 NOTE — Progress Notes (Signed)
Patient did not have preoperative order for IV antibiotic SSI prophylaxis. (G8918)  Patient did not experience any of the following events: a burn prior to discharge; a fall within the facility; wrong site/side/patient/procedure/implant event; or a hospital transfer or hospital admission upon discharge from the facility. (G8907)  

## 2012-12-23 NOTE — Op Note (Signed)
[facility] [address] [place], [address]   COLONOSCOPY PROCEDURE REPORT  PATIENT: Mark Cowan, Mark Cowan  MR#: [id number] BIRTHDATE: 07-18-1933 , 79  yrs. old GENDER: Male ENDOSCOPIST: Hart Carwin, MD REFERRED XL:KGMWN Hedrick, M.D. PROCEDURE DATE:  12/23/2012 PROCEDURE:   Colonoscopy, screening First Screening Colonoscopy - Avg.  risk and is 50 yrs.  old or older - No.  Prior Negative Screening - Now for repeat screening. Other: See Comments  History of Adenoma - Now for follow-up colonoscopy & has been > or = to 3 yrs.  N/A  Polyps Removed Today? No.  Recommend repeat exam, <10 yrs? No. ASA CLASS:   Class III INDICATIONS:Average risk patient for colon cancer. MEDICATIONS: MAC sedation, administered by CRNA and propofol (Diprivan) 150mg  IV  DESCRIPTION OF PROCEDURE:   After the risks benefits and alternatives of the procedure were thoroughly explained, informed consent was obtained.  A digital rectal exam revealed no abnormalities of the rectum.   The LB [id number]  endoscope was introduced through the anus and advanced to the cecum, which was identified by both the appendix and ileocecal valve. No adverse events experienced.   The quality of the prep was good, using MoviPrep  The instrument was then slowly withdrawn as the colon was fully examined.      COLON FINDINGS: There was moderate diverticulosis noted throughout the entire examined colon with associated muscular hypertrophy. I was unable to locate the anastomosis from  prior sigmoid  resection , there was no stricture. Retroflexed views revealed no abnormalities. The time to cecum=3 minutes 5 seconds.  Withdrawal time=6 minutes 0 seconds.  The scope was withdrawn and the procedure completed.There were small internal hemorrhoids COMPLICATIONS: There were no complications.  ENDOSCOPIC IMPRESSION: There was moderate diverticulosis noted throughout the entire examined  colon  RECOMMENDATIONS: 1.  High fiber diet 2.   Metamucil 1 tsp daily 3  . no recall due to age   eSigned:  Hart Carwin, MD 12/23/2012 2:50 PM   cc:   PATIENT NAME:  Soma, Lizak MR#: [id number]

## 2012-12-24 ENCOUNTER — Telehealth: Payer: Self-pay | Admitting: *Deleted

## 2012-12-24 NOTE — Telephone Encounter (Signed)
  Follow up Call-  Call back number 12/23/2012  Post procedure Call Back phone  # [phone]  Permission to leave phone message Yes   spoke with wife; pt had already left for work  Patient questions:  Do you have a fever, pain , or abdominal swelling? no Pain Score  0 *  Have you tolerated food without any problems? yes  Have you been able to return to your normal activities? yes  Do you have any questions about your discharge instructions: Diet   no Medications  no Follow up visit  no  Do you have questions or concerns about your Care? no  Actions: * If pain score is 4 or above: No action needed, pain <4.

## 2013-11-18 IMAGING — CR DG CHEST 2V
2 series · 2 of 2 positions shown · non-contrast
Comparison: None.

CLINICAL DATA: Preop.

CHEST - 2 VIEW

[view not recorded (1 of 2)]
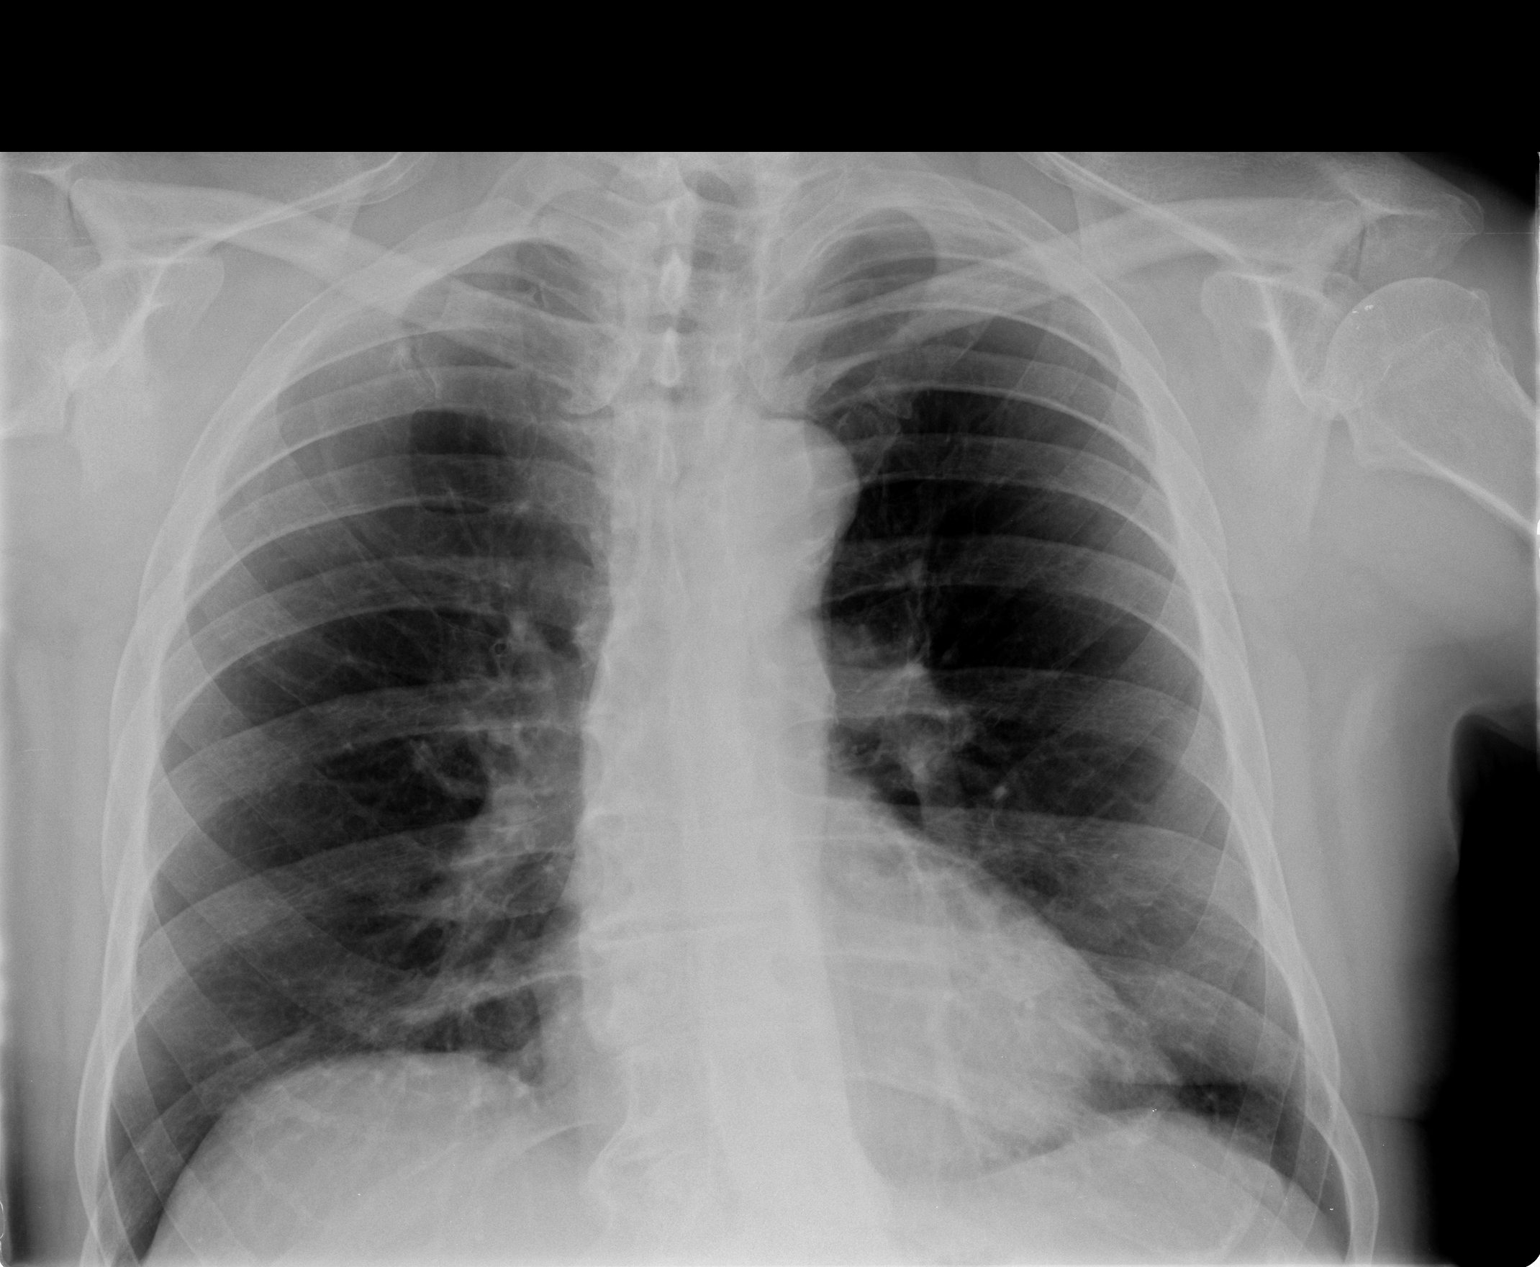

[view not recorded (2 of 2)]
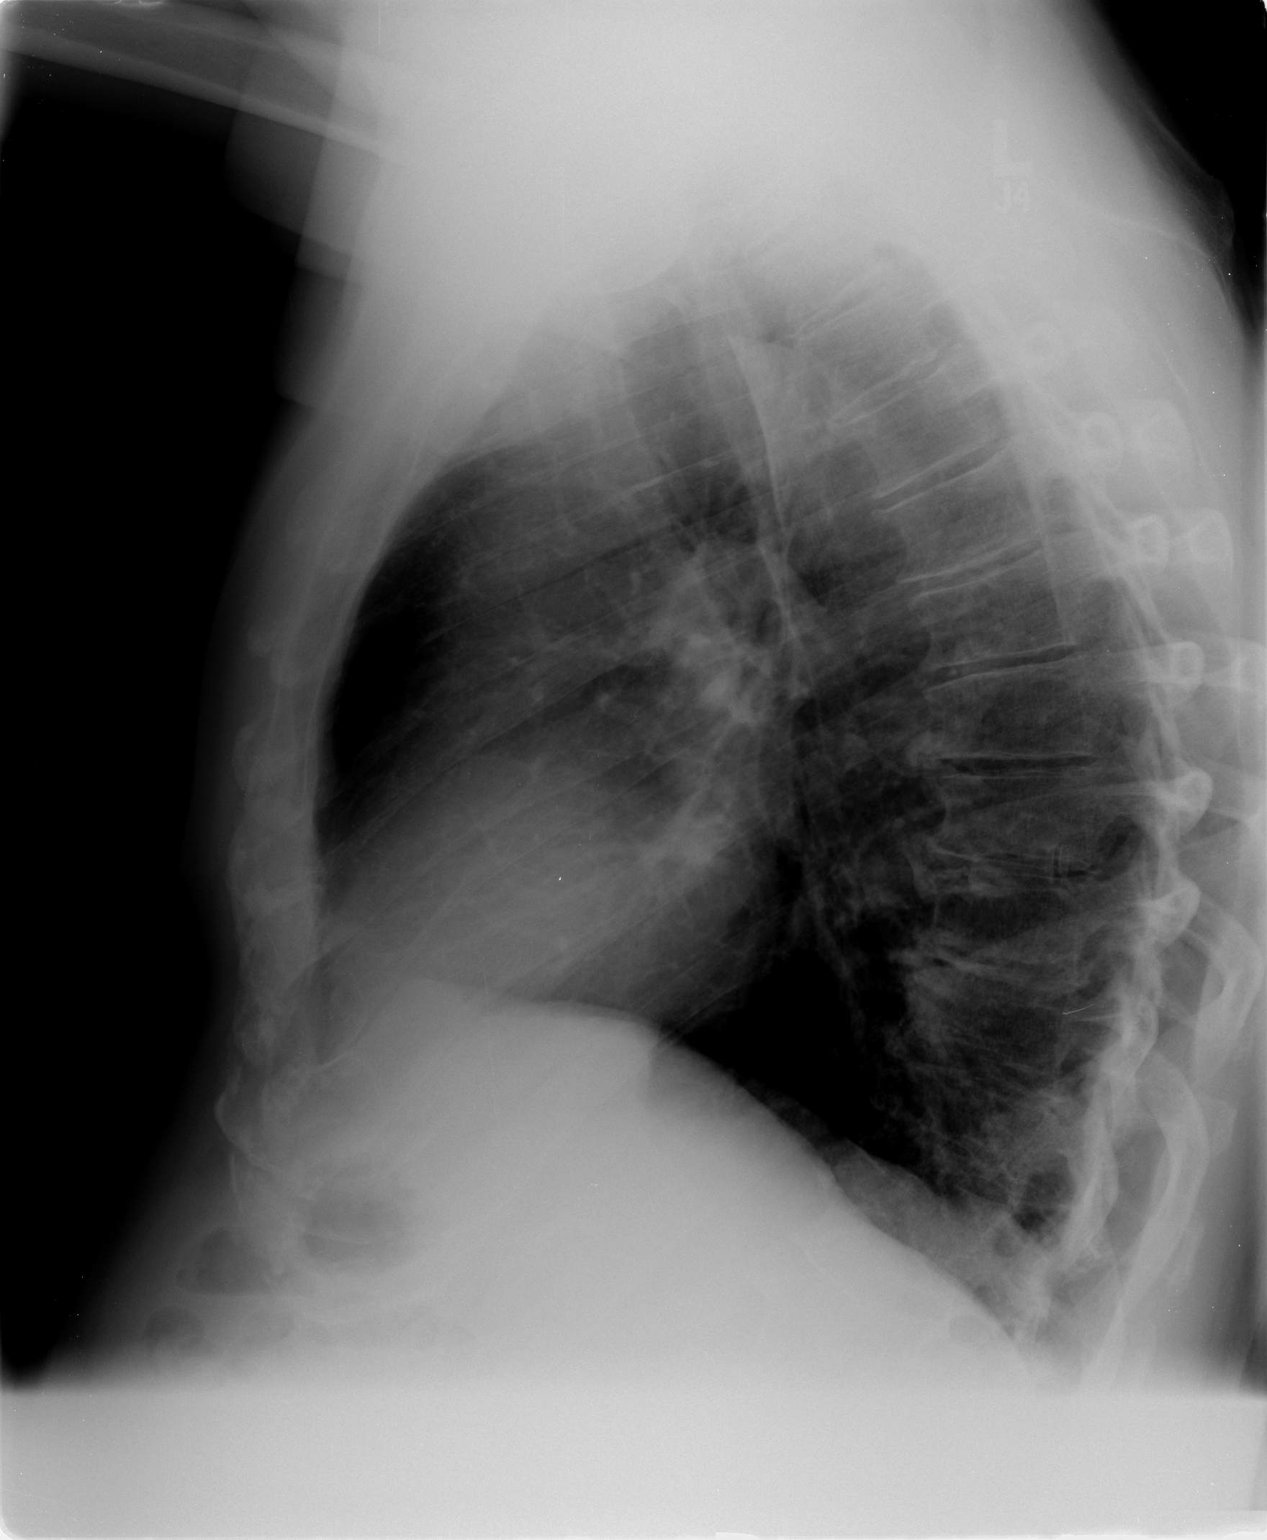

[2 of 2 positions shown; findings below may reference images not displayed]

FINDINGS: Trachea is midline.  Heart size normal.  Lungs are clear.
No pleural fluid.  Degenerative changes are seen in the spine.
IMPRESSION: No acute findings.

## 2014-04-29 ENCOUNTER — Other Ambulatory Visit: Payer: Self-pay | Admitting: Nurse Practitioner

## 2014-04-29 ENCOUNTER — Telehealth: Payer: Self-pay | Admitting: Internal Medicine

## 2014-04-29 MED ORDER — CIPROFLOXACIN HCL 500 MG PO TABS: 500.0000 mg | ORAL_TABLET | Freq: Two times a day (BID) | ORAL | Status: DC

## 2014-04-29 NOTE — Telephone Encounter (Signed)
Having LLQ pain like he has had with diverticulitis Only a few hrs Wants his cipro refilled I did that but told him he should give this some time to see if persists or not as may just be spasm  He related he has used cipro intermittently over the years for similar attacks

## 2015-02-17 ENCOUNTER — Telehealth: Payer: Self-pay | Admitting: Gastroenterology

## 2015-02-17 NOTE — Telephone Encounter (Signed)
His wife called.  He's had abd pain, distention, LLQ pain mainly.  Vomited once.  She says this is like his previous diverticulitis episodes.  No fevers.   I called in cipro 500 twice daily, flagyl 250mg  tid (10 days each) and zofran.  She knows if he worsens to call back or take him to the ER.

## 2015-04-22 DIAGNOSIS — H2513 Age-related nuclear cataract, bilateral: Secondary | ICD-10-CM | POA: Diagnosis not present

## 2015-05-12 DIAGNOSIS — D225 Melanocytic nevi of trunk: Secondary | ICD-10-CM | POA: Diagnosis not present

## 2015-05-12 DIAGNOSIS — D224 Melanocytic nevi of scalp and neck: Secondary | ICD-10-CM | POA: Diagnosis not present

## 2015-05-12 DIAGNOSIS — Z85820 Personal history of malignant melanoma of skin: Secondary | ICD-10-CM | POA: Diagnosis not present

## 2015-05-12 DIAGNOSIS — D2271 Melanocytic nevi of right lower limb, including hip: Secondary | ICD-10-CM | POA: Diagnosis not present

## 2015-05-12 DIAGNOSIS — L821 Other seborrheic keratosis: Secondary | ICD-10-CM | POA: Diagnosis not present

## 2015-05-12 DIAGNOSIS — D1801 Hemangioma of skin and subcutaneous tissue: Secondary | ICD-10-CM | POA: Diagnosis not present

## 2015-05-12 DIAGNOSIS — L570 Actinic keratosis: Secondary | ICD-10-CM | POA: Diagnosis not present

## 2015-05-12 DIAGNOSIS — Z85828 Personal history of other malignant neoplasm of skin: Secondary | ICD-10-CM | POA: Diagnosis not present

## 2015-06-09 DIAGNOSIS — R509 Fever, unspecified: Secondary | ICD-10-CM | POA: Diagnosis not present

## 2015-07-04 DIAGNOSIS — R7309 Other abnormal glucose: Secondary | ICD-10-CM | POA: Diagnosis not present

## 2015-07-04 DIAGNOSIS — R7303 Prediabetes: Secondary | ICD-10-CM | POA: Diagnosis not present

## 2015-07-07 DIAGNOSIS — R739 Hyperglycemia, unspecified: Secondary | ICD-10-CM | POA: Diagnosis not present

## 2015-07-07 DIAGNOSIS — I10 Essential (primary) hypertension: Secondary | ICD-10-CM | POA: Diagnosis not present

## 2015-07-07 DIAGNOSIS — G609 Hereditary and idiopathic neuropathy, unspecified: Secondary | ICD-10-CM | POA: Diagnosis not present

## 2015-07-07 DIAGNOSIS — Z125 Encounter for screening for malignant neoplasm of prostate: Secondary | ICD-10-CM | POA: Diagnosis not present

## 2015-07-07 DIAGNOSIS — E785 Hyperlipidemia, unspecified: Secondary | ICD-10-CM | POA: Diagnosis not present

## 2015-11-22 DIAGNOSIS — I10 Essential (primary) hypertension: Secondary | ICD-10-CM | POA: Diagnosis not present

## 2015-11-22 DIAGNOSIS — R739 Hyperglycemia, unspecified: Secondary | ICD-10-CM | POA: Diagnosis not present

## 2015-11-22 DIAGNOSIS — E785 Hyperlipidemia, unspecified: Secondary | ICD-10-CM | POA: Diagnosis not present

## 2015-11-22 DIAGNOSIS — Z125 Encounter for screening for malignant neoplasm of prostate: Secondary | ICD-10-CM | POA: Diagnosis not present

## 2015-11-29 DIAGNOSIS — Z0000 Encounter for general adult medical examination without abnormal findings: Secondary | ICD-10-CM | POA: Diagnosis not present

## 2016-01-02 DIAGNOSIS — N401 Enlarged prostate with lower urinary tract symptoms: Secondary | ICD-10-CM | POA: Diagnosis not present

## 2016-01-02 DIAGNOSIS — R351 Nocturia: Secondary | ICD-10-CM | POA: Diagnosis not present

## 2016-01-14 DIAGNOSIS — L03115 Cellulitis of right lower limb: Secondary | ICD-10-CM | POA: Diagnosis not present

## 2016-01-14 DIAGNOSIS — S61012A Laceration without foreign body of left thumb without damage to nail, initial encounter: Secondary | ICD-10-CM | POA: Diagnosis not present

## 2016-01-14 DIAGNOSIS — S81801A Unspecified open wound, right lower leg, initial encounter: Secondary | ICD-10-CM | POA: Diagnosis not present

## 2016-01-23 DIAGNOSIS — Z4802 Encounter for removal of sutures: Secondary | ICD-10-CM | POA: Diagnosis not present

## 2016-02-02 DIAGNOSIS — L720 Epidermal cyst: Secondary | ICD-10-CM | POA: Diagnosis not present

## 2016-02-02 DIAGNOSIS — L814 Other melanin hyperpigmentation: Secondary | ICD-10-CM | POA: Diagnosis not present

## 2016-02-02 DIAGNOSIS — Z85820 Personal history of malignant melanoma of skin: Secondary | ICD-10-CM | POA: Diagnosis not present

## 2016-02-02 DIAGNOSIS — L570 Actinic keratosis: Secondary | ICD-10-CM | POA: Diagnosis not present

## 2016-02-02 DIAGNOSIS — Z85828 Personal history of other malignant neoplasm of skin: Secondary | ICD-10-CM | POA: Diagnosis not present

## 2016-02-02 DIAGNOSIS — D1801 Hemangioma of skin and subcutaneous tissue: Secondary | ICD-10-CM | POA: Diagnosis not present

## 2016-04-27 DIAGNOSIS — J069 Acute upper respiratory infection, unspecified: Secondary | ICD-10-CM | POA: Diagnosis not present

## 2016-05-22 DIAGNOSIS — Z85828 Personal history of other malignant neoplasm of skin: Secondary | ICD-10-CM | POA: Diagnosis not present

## 2016-05-22 DIAGNOSIS — Z85820 Personal history of malignant melanoma of skin: Secondary | ICD-10-CM | POA: Diagnosis not present

## 2016-05-22 DIAGNOSIS — L821 Other seborrheic keratosis: Secondary | ICD-10-CM | POA: Diagnosis not present

## 2016-05-22 DIAGNOSIS — D1801 Hemangioma of skin and subcutaneous tissue: Secondary | ICD-10-CM | POA: Diagnosis not present

## 2016-05-22 DIAGNOSIS — D2271 Melanocytic nevi of right lower limb, including hip: Secondary | ICD-10-CM | POA: Diagnosis not present

## 2016-05-22 DIAGNOSIS — L918 Other hypertrophic disorders of the skin: Secondary | ICD-10-CM | POA: Diagnosis not present

## 2016-05-22 DIAGNOSIS — L814 Other melanin hyperpigmentation: Secondary | ICD-10-CM | POA: Diagnosis not present

## 2016-09-14 DIAGNOSIS — M170 Bilateral primary osteoarthritis of knee: Secondary | ICD-10-CM | POA: Diagnosis not present

## 2016-09-14 DIAGNOSIS — M25562 Pain in left knee: Secondary | ICD-10-CM | POA: Diagnosis not present

## 2016-09-14 DIAGNOSIS — M25561 Pain in right knee: Secondary | ICD-10-CM | POA: Diagnosis not present

## 2016-10-05 DIAGNOSIS — M1712 Unilateral primary osteoarthritis, left knee: Secondary | ICD-10-CM | POA: Diagnosis not present

## 2016-10-31 ENCOUNTER — Encounter
Admission: RE | Admit: 2016-10-31 | Discharge: 2016-10-31 | Disposition: A | Discharge status: Home / Self Care | Payer: PPO | Source: Ambulatory Visit | Attending: Orthopedic Surgery | Admitting: Orthopedic Surgery

## 2016-10-31 DIAGNOSIS — I440 Atrioventricular block, first degree: Secondary | ICD-10-CM | POA: Insufficient documentation

## 2016-10-31 DIAGNOSIS — R001 Bradycardia, unspecified: Secondary | ICD-10-CM | POA: Diagnosis not present

## 2016-10-31 DIAGNOSIS — M1712 Unilateral primary osteoarthritis, left knee: Secondary | ICD-10-CM | POA: Diagnosis not present

## 2016-10-31 DIAGNOSIS — Z01818 Encounter for other preprocedural examination: Secondary | ICD-10-CM | POA: Diagnosis not present

## 2016-10-31 DIAGNOSIS — I10 Essential (primary) hypertension: Secondary | ICD-10-CM | POA: Diagnosis not present

## 2016-10-31 HISTORY — DX: Polyneuropathy, unspecified: G62.9

## 2016-10-31 HISTORY — DX: Hyperlipidemia, unspecified: E78.5

## 2016-10-31 LAB — URINALYSIS, ROUTINE W REFLEX MICROSCOPIC
BILIRUBIN URINE: NEGATIVE
GLUCOSE, UA: 50 mg/dL — AB
HGB URINE DIPSTICK: NEGATIVE
Ketones, ur: NEGATIVE mg/dL
Leukocytes, UA: NEGATIVE
NITRITE: NEGATIVE
PH: 5 (ref 5.0–8.0)
Protein, ur: NEGATIVE mg/dL
SPECIFIC GRAVITY, URINE: 1.02 (ref 1.005–1.030)

## 2016-10-31 LAB — COMPREHENSIVE METABOLIC PANEL
ALT: 17 U/L (ref 17–63)
ANION GAP: 9 (ref 5–15)
AST: 28 U/L (ref 15–41)
Albumin: 4.4 g/dL (ref 3.5–5.0)
Alkaline Phosphatase: 37 U/L — ABNORMAL LOW (ref 38–126)
BUN: 20 mg/dL (ref 6–20)
CHLORIDE: 106 mmol/L (ref 101–111)
CO2: 26 mmol/L (ref 22–32)
Calcium: 10 mg/dL (ref 8.9–10.3)
Creatinine, Ser: 0.88 mg/dL (ref 0.61–1.24)
GFR calc Af Amer: >60 mL/min (ref >60)
Glucose, Bld: 125 mg/dL — ABNORMAL HIGH (ref 65–99)
POTASSIUM: 3.6 mmol/L (ref 3.5–5.1)
Sodium: 141 mmol/L (ref 135–145)
Total Bilirubin: 0.8 mg/dL (ref 0.3–1.2)
Total Protein: 7.4 g/dL (ref 6.5–8.1)

## 2016-10-31 LAB — CBC
HCT: 40.9 % (ref 40.0–52.0)
Hemoglobin: 14.2 g/dL (ref 13.0–18.0)
MCH: 30.2 pg (ref 26.0–34.0)
MCHC: 34.8 g/dL (ref 32.0–36.0)
MCV: 86.9 fL (ref 80.0–100.0)
PLATELETS: 237 10*3/uL (ref 150–440)
RBC: 4.71 MIL/uL (ref 4.40–5.90)
RDW: 12.9 % (ref 11.5–14.5)
WBC: 5.3 10*3/uL (ref 3.8–10.6)

## 2016-10-31 LAB — TYPE AND SCREEN
ABO/RH(D): A POS
ANTIBODY SCREEN: NEGATIVE

## 2016-10-31 LAB — C-REACTIVE PROTEIN: CRP: <0.8 mg/dL (ref <1.0)

## 2016-10-31 LAB — SEDIMENTATION RATE: SED RATE: 14 mm/h (ref 0–20)

## 2016-10-31 LAB — APTT: APTT: 30 s (ref 24–36)

## 2016-10-31 LAB — PROTIME-INR
INR: 1.12
PROTHROMBIN TIME: 14.5 s (ref 11.4–15.2)

## 2016-10-31 LAB — SURGICAL PCR SCREEN
MRSA, PCR: NEGATIVE
Staphylococcus aureus: NEGATIVE

## 2016-10-31 NOTE — Patient Instructions (Signed)
Your procedure is scheduled on: 11/12/16 Mon Report to Same Day Surgery 2nd floor medical mall [facility] Entrance-take elevator on left to 2nd floor.  Check in with surgery information desk.) To find out your arrival time please call [phone] between 1PM - 3PM on 11/09/16 Fri  Remember: Instructions that are not followed completely may result in serious medical risk, up to and including death, or upon the discretion of your surgeon and anesthesiologist your surgery may need to be rescheduled.    _x___ 1. Do not eat food or drink liquids after midnight. No gum chewing or                              hard candies.     __x__ 2. No Alcohol for 24 hours before or after surgery.   __x__3. No Smoking for 24 prior to surgery.   ____  4. Bring all medications with you on the day of surgery if instructed.    __x__ 5. Notify your doctor if there is any change in your medical condition     (cold, fever, infections).     Do not wear jewelry, make-up, hairpins, clips or nail polish.  Do not wear lotions, powders, or perfumes. You may wear deodorant.  Do not shave 48 hours prior to surgery. Men may shave face and neck.  Do not bring valuables to the hospital.    [facility] is not responsible for any belongings or valuables.               Contacts, dentures or bridgework may not be worn into surgery.  Leave your suitcase in the car. After surgery it may be brought to your room.  For patients admitted to the hospital, discharge time is determined by your                       treatment team.   Patients discharged the day of surgery will not be allowed to drive home.  You will need someone to drive you home and stay with you the night of your procedure.    Please read over the following fact sheets that you were given:   [facility] Preparing for Surgery and or MRSA Information   _x___ Take anti-hypertensive (unless it includes a diuretic), cardiac, seizure, asthma,     anti-reflux and  psychiatric medicines. These include:  1. amLODipine (NORVASC)  2.  3.  4.  5.  6.  ____Fleets enema or Magnesium Citrate as directed.   _x___ Use CHG Soap or sage wipes as directed on instruction sheet   ____ Use inhalers on the day of surgery and bring to hospital day of surgery  ____ Stop Metformin and Janumet 2 days prior to surgery.    ____ Take 1/2 of usual insulin dose the night before surgery and none on the morning     surgery.   _x___ Follow recommendations from Cardiologist, Pulmonologist or PCP regarding          stopping Aspirin, Coumadin, Pllavix ,Eliquis, Effient, or Pradaxa, and Pletal.  X____Stop Anti-inflammatories such as Advil, Aleve, Ibuprofen, Motrin, Naproxen, Naprosyn, Goodies powders or aspirin products. OK to take Tylenol and                          Celebrex.   _x___ Stop supplements until after surgery.  But may continue Vitamin D, Vitamin  B,       and multivitamin.   ____ Bring C-Pap to the hospital.

## 2016-11-01 LAB — URINE CULTURE
Culture: NO GROWTH
Special Requests: NORMAL

## 2016-11-11 MED ORDER — TRANEXAMIC ACID 1000 MG/10ML IV SOLN
1000.0000 mg | INTRAVENOUS | Status: AC
  Administered 2016-11-12: 1000 mg via INTRAVENOUS
  Filled 2016-11-11: qty 10

## 2016-11-11 MED ORDER — CEFAZOLIN SODIUM-DEXTROSE 2-4 GM/100ML-% IV SOLN
2.0000 g | INTRAVENOUS | Status: AC
  Administered 2016-11-12: 2 g via INTRAVENOUS

## 2016-11-12 ENCOUNTER — Encounter
Admission: RE | Disposition: A | Discharge status: Home / Self Care | Payer: Self-pay | Source: Ambulatory Visit | Attending: Orthopedic Surgery

## 2016-11-12 ENCOUNTER — Inpatient Hospital Stay: Payer: PPO | Admitting: Anesthesiology

## 2016-11-12 ENCOUNTER — Inpatient Hospital Stay
Admission: RE | Admit: 2016-11-12 | Discharge: 2016-11-14 | DRG: 470 | Disposition: A | Discharge status: Home / Self Care | Payer: PPO | Source: Ambulatory Visit | Attending: Orthopedic Surgery | Admitting: Orthopedic Surgery

## 2016-11-12 ENCOUNTER — Inpatient Hospital Stay: Payer: PPO

## 2016-11-12 ENCOUNTER — Encounter: Payer: Self-pay | Admitting: *Deleted

## 2016-11-12 DIAGNOSIS — Z96652 Presence of left artificial knee joint: Secondary | ICD-10-CM | POA: Diagnosis not present

## 2016-11-12 DIAGNOSIS — Z96659 Presence of unspecified artificial knee joint: Secondary | ICD-10-CM

## 2016-11-12 DIAGNOSIS — Z87891 Personal history of nicotine dependence: Secondary | ICD-10-CM | POA: Diagnosis not present

## 2016-11-12 DIAGNOSIS — Z85820 Personal history of malignant melanoma of skin: Secondary | ICD-10-CM | POA: Diagnosis not present

## 2016-11-12 DIAGNOSIS — N400 Enlarged prostate without lower urinary tract symptoms: Secondary | ICD-10-CM | POA: Diagnosis not present

## 2016-11-12 DIAGNOSIS — M1712 Unilateral primary osteoarthritis, left knee: Principal | ICD-10-CM | POA: Diagnosis present

## 2016-11-12 DIAGNOSIS — I10 Essential (primary) hypertension: Secondary | ICD-10-CM | POA: Diagnosis not present

## 2016-11-12 DIAGNOSIS — G629 Polyneuropathy, unspecified: Secondary | ICD-10-CM | POA: Diagnosis not present

## 2016-11-12 DIAGNOSIS — K5730 Diverticulosis of large intestine without perforation or abscess without bleeding: Secondary | ICD-10-CM | POA: Diagnosis not present

## 2016-11-12 DIAGNOSIS — Z471 Aftercare following joint replacement surgery: Secondary | ICD-10-CM | POA: Diagnosis not present

## 2016-11-12 HISTORY — PX: KNEE ARTHROPLASTY: SHX992

## 2016-11-12 LAB — ABO/RH: ABO/RH(D): A POS

## 2016-11-12 SURGERY — ARTHROPLASTY, KNEE, TOTAL, USING IMAGELESS COMPUTER-ASSISTED NAVIGATION
Anesthesia: Spinal | Site: Knee | Laterality: Left | Wound class: Clean

## 2016-11-12 MED ORDER — ACETAMINOPHEN 10 MG/ML IV SOLN
1000.0000 mg | Freq: Four times a day (QID) | INTRAVENOUS | Status: AC
  Administered 2016-11-12 – 2016-11-13 (×3): 1000 mg via INTRAVENOUS
  Filled 2016-11-12 (×5): qty 100

## 2016-11-12 MED ORDER — FENTANYL CITRATE (PF) 100 MCG/2ML IJ SOLN
25.0000 ug | INTRAMUSCULAR | Status: DC | PRN
  Administered 2016-11-12 (×4): 25 ug via INTRAVENOUS

## 2016-11-12 MED ORDER — PROPOFOL 10 MG/ML IV BOLUS
INTRAVENOUS | Status: AC
  Filled 2016-11-12: qty 20

## 2016-11-12 MED ORDER — LIDOCAINE HCL (PF) 2 % IJ SOLN
INTRAMUSCULAR | Status: AC
  Filled 2016-11-12: qty 2

## 2016-11-12 MED ORDER — OXYCODONE HCL 5 MG PO TABS
5.0000 mg | ORAL_TABLET | ORAL | Status: DC | PRN
  Administered 2016-11-12 (×2): 10 mg via ORAL
  Administered 2016-11-13: 5 mg via ORAL
  Administered 2016-11-13 (×2): 10 mg via ORAL
  Filled 2016-11-12 (×3): qty 2
  Filled 2016-11-12: qty 1
  Filled 2016-11-12: qty 2

## 2016-11-12 MED ORDER — TETRACAINE HCL 1 % IJ SOLN
INTRAMUSCULAR | Status: DC | PRN
  Administered 2016-11-12: 8 mg via INTRASPINAL

## 2016-11-12 MED ORDER — DUTASTERIDE 0.5 MG PO CAPS
0.5000 mg | ORAL_CAPSULE | Freq: Every day | ORAL | Status: DC
  Administered 2016-11-12 – 2016-11-14 (×3): 0.5 mg via ORAL
  Filled 2016-11-12 (×3): qty 1

## 2016-11-12 MED ORDER — FERROUS SULFATE 325 (65 FE) MG PO TABS
325.0000 mg | ORAL_TABLET | Freq: Two times a day (BID) | ORAL | Status: DC
  Administered 2016-11-12 – 2016-11-14 (×4): 325 mg via ORAL
  Filled 2016-11-12 (×4): qty 1

## 2016-11-12 MED ORDER — OMEGA-3-ACID ETHYL ESTERS 1 G PO CAPS
1.0000 g | ORAL_CAPSULE | Freq: Two times a day (BID) | ORAL | Status: DC
  Administered 2016-11-12 – 2016-11-14 (×4): 1 g via ORAL
  Filled 2016-11-12 (×8): qty 1

## 2016-11-12 MED ORDER — NEOMYCIN-POLYMYXIN B GU 40-200000 IR SOLN
Status: DC | PRN
  Administered 2016-11-12: 14 mL

## 2016-11-12 MED ORDER — PROPOFOL 10 MG/ML IV BOLUS
INTRAVENOUS | Status: DC | PRN
  Administered 2016-11-12 (×2): 20 mg via INTRAVENOUS

## 2016-11-12 MED ORDER — ACETAMINOPHEN 10 MG/ML IV SOLN
INTRAVENOUS | Status: AC
Start: 2016-11-12 — End: 2016-11-12
  Filled 2016-11-12: qty 100

## 2016-11-12 MED ORDER — BUPIVACAINE LIPOSOME 1.3 % IJ SUSP
INTRAMUSCULAR | Status: AC
  Filled 2016-11-12: qty 20

## 2016-11-12 MED ORDER — MENTHOL 3 MG MT LOZG
1.0000 | LOZENGE | OROMUCOSAL | Status: DC | PRN
  Filled 2016-11-12: qty 9

## 2016-11-12 MED ORDER — MIDAZOLAM HCL 2 MG/2ML IJ SOLN
INTRAMUSCULAR | Status: AC
  Filled 2016-11-12: qty 2

## 2016-11-12 MED ORDER — SODIUM CHLORIDE 0.9 % IV SOLN
INTRAVENOUS | Status: DC | PRN
  Administered 2016-11-12: 60 mL

## 2016-11-12 MED ORDER — ROSUVASTATIN CALCIUM 10 MG PO TABS
10.0000 mg | ORAL_TABLET | ORAL | Status: DC
  Administered 2016-11-13 (×2): 10 mg via ORAL
  Filled 2016-11-12 (×2): qty 1

## 2016-11-12 MED ORDER — MAGNESIUM HYDROXIDE 400 MG/5ML PO SUSP
30.0000 mL | Freq: Every day | ORAL | Status: DC | PRN
  Administered 2016-11-13 – 2016-11-14 (×2): 30 mL via ORAL
  Filled 2016-11-12 (×2): qty 30

## 2016-11-12 MED ORDER — CELECOXIB 200 MG PO CAPS
200.0000 mg | ORAL_CAPSULE | Freq: Two times a day (BID) | ORAL | Status: DC
  Administered 2016-11-12 – 2016-11-14 (×5): 200 mg via ORAL
  Filled 2016-11-12 (×5): qty 1

## 2016-11-12 MED ORDER — PROPOFOL 500 MG/50ML IV EMUL
INTRAVENOUS | Status: DC | PRN
  Administered 2016-11-12: 25 ug/kg/min via INTRAVENOUS

## 2016-11-12 MED ORDER — ONDANSETRON HCL 4 MG PO TABS: 4.0000 mg | ORAL_TABLET | Freq: Four times a day (QID) | ORAL | Status: DC | PRN

## 2016-11-12 MED ORDER — DIPHENHYDRAMINE HCL 12.5 MG/5ML PO ELIX: 12.5000 mg | ORAL_SOLUTION | ORAL | Status: DC | PRN

## 2016-11-12 MED ORDER — LOSARTAN POTASSIUM 50 MG PO TABS
100.0000 mg | ORAL_TABLET | Freq: Every day | ORAL | Status: DC
  Administered 2016-11-13 – 2016-11-14 (×2): 100 mg via ORAL
  Filled 2016-11-12 (×2): qty 2

## 2016-11-12 MED ORDER — PHENYLEPHRINE HCL 10 MG/ML IJ SOLN
INTRAMUSCULAR | Status: AC
  Filled 2016-11-12: qty 1

## 2016-11-12 MED ORDER — LIDOCAINE HCL 2 % EX GEL
CUTANEOUS | Status: AC
Start: 2016-11-12 — End: 2016-11-12
  Filled 2016-11-12: qty 5

## 2016-11-12 MED ORDER — ACETAMINOPHEN 650 MG RE SUPP: 650.0000 mg | Freq: Four times a day (QID) | RECTAL | Status: DC | PRN

## 2016-11-12 MED ORDER — PROPOFOL 500 MG/50ML IV EMUL
INTRAVENOUS | Status: AC
  Filled 2016-11-12: qty 50

## 2016-11-12 MED ORDER — VITAMIN D 1000 UNITS PO TABS
2000.0000 [IU] | ORAL_TABLET | Freq: Every day | ORAL | Status: DC
  Administered 2016-11-13 – 2016-11-14 (×2): 2000 [IU] via ORAL
  Filled 2016-11-12 (×2): qty 2

## 2016-11-12 MED ORDER — ONDANSETRON HCL 4 MG/2ML IJ SOLN: 4.0000 mg | Freq: Once | INTRAMUSCULAR | Status: DC | PRN

## 2016-11-12 MED ORDER — VITAMIN B-12 1000 MCG PO TABS
2500.0000 ug | ORAL_TABLET | Freq: Every day | ORAL | Status: DC
  Administered 2016-11-13 – 2016-11-14 (×2): 2500 ug via ORAL
  Filled 2016-11-12: qty 3
  Filled 2016-11-12: qty 2.5
  Filled 2016-11-12: qty 3
  Filled 2016-11-12: qty 2.5

## 2016-11-12 MED ORDER — BUPIVACAINE HCL (PF) 0.5 % IJ SOLN
INTRAMUSCULAR | Status: DC | PRN
  Administered 2016-11-12: 2 mL via INTRATHECAL

## 2016-11-12 MED ORDER — BISACODYL 10 MG RE SUPP: 10.0000 mg | Freq: Every day | RECTAL | Status: DC | PRN

## 2016-11-12 MED ORDER — VITAMIN C 500 MG PO TABS
500.0000 mg | ORAL_TABLET | Freq: Every day | ORAL | Status: DC
  Administered 2016-11-13 – 2016-11-14 (×2): 500 mg via ORAL
  Filled 2016-11-12 (×3): qty 1

## 2016-11-12 MED ORDER — LOSARTAN POTASSIUM-HCTZ 100-25 MG PO TABS: 1.0000 | ORAL_TABLET | Freq: Every day | ORAL | Status: DC

## 2016-11-12 MED ORDER — TETRACAINE HCL 1 % IJ SOLN
INTRAMUSCULAR | Status: AC
  Filled 2016-11-12: qty 2

## 2016-11-12 MED ORDER — GLYCOPYRROLATE 0.2 MG/ML IJ SOLN
0.4000 mg | Freq: Once | INTRAMUSCULAR | Status: AC
  Administered 2016-11-12: 0.4 mg via INTRAVENOUS

## 2016-11-12 MED ORDER — GABAPENTIN 300 MG PO CAPS
300.0000 mg | ORAL_CAPSULE | Freq: Every evening | ORAL | Status: DC
  Administered 2016-11-12 – 2016-11-13 (×2): 300 mg via ORAL
  Filled 2016-11-12 (×2): qty 1

## 2016-11-12 MED ORDER — SODIUM CHLORIDE 0.9 % IV SOLN
INTRAVENOUS | Status: DC
  Administered 2016-11-12: 12:00:00 via INTRAVENOUS

## 2016-11-12 MED ORDER — FAMOTIDINE 20 MG PO TABS
ORAL_TABLET | ORAL | Status: AC
  Administered 2016-11-12: 20 mg via ORAL
  Filled 2016-11-12: qty 1

## 2016-11-12 MED ORDER — ENOXAPARIN SODIUM 30 MG/0.3ML ~~LOC~~ SOLN
30.0000 mg | Freq: Two times a day (BID) | SUBCUTANEOUS | Status: DC
  Administered 2016-11-13 – 2016-11-14 (×3): 30 mg via SUBCUTANEOUS
  Filled 2016-11-12 (×3): qty 0.3

## 2016-11-12 MED ORDER — TRAMADOL HCL 50 MG PO TABS
50.0000 mg | ORAL_TABLET | ORAL | Status: DC | PRN
  Administered 2016-11-12 – 2016-11-14 (×4): 100 mg via ORAL
  Filled 2016-11-12 (×4): qty 2

## 2016-11-12 MED ORDER — MIDAZOLAM HCL 5 MG/5ML IJ SOLN
INTRAMUSCULAR | Status: DC | PRN
  Administered 2016-11-12 (×4): 1 mg via INTRAVENOUS

## 2016-11-12 MED ORDER — LACTATED RINGERS IV SOLN
INTRAVENOUS | Status: DC
  Administered 2016-11-12 (×2): via INTRAVENOUS

## 2016-11-12 MED ORDER — ACETAMINOPHEN 10 MG/ML IV SOLN
INTRAVENOUS | Status: DC | PRN
  Administered 2016-11-12: 1000 mg via INTRAVENOUS

## 2016-11-12 MED ORDER — ACETAMINOPHEN 325 MG PO TABS: 650.0000 mg | ORAL_TABLET | Freq: Four times a day (QID) | ORAL | Status: DC | PRN

## 2016-11-12 MED ORDER — SENNOSIDES-DOCUSATE SODIUM 8.6-50 MG PO TABS
1.0000 | ORAL_TABLET | Freq: Two times a day (BID) | ORAL | Status: DC
  Administered 2016-11-12 – 2016-11-14 (×5): 1 via ORAL
  Filled 2016-11-12 (×5): qty 1

## 2016-11-12 MED ORDER — METOCLOPRAMIDE HCL 10 MG PO TABS
10.0000 mg | ORAL_TABLET | Freq: Three times a day (TID) | ORAL | Status: AC
  Administered 2016-11-12 – 2016-11-14 (×8): 10 mg via ORAL
  Filled 2016-11-12 (×8): qty 1

## 2016-11-12 MED ORDER — GLYCOPYRROLATE 0.2 MG/ML IJ SOLN
INTRAMUSCULAR | Status: AC
  Filled 2016-11-12: qty 1

## 2016-11-12 MED ORDER — CHLORHEXIDINE GLUCONATE 4 % EX LIQD: 60.0000 mL | Freq: Once | CUTANEOUS | Status: DC

## 2016-11-12 MED ORDER — PANTOPRAZOLE SODIUM 40 MG PO TBEC
40.0000 mg | DELAYED_RELEASE_TABLET | Freq: Two times a day (BID) | ORAL | Status: DC
  Administered 2016-11-12 – 2016-11-14 (×5): 40 mg via ORAL
  Filled 2016-11-12 (×5): qty 1

## 2016-11-12 MED ORDER — CEFAZOLIN SODIUM-DEXTROSE 2-4 GM/100ML-% IV SOLN
INTRAVENOUS | Status: AC
  Filled 2016-11-12: qty 100

## 2016-11-12 MED ORDER — BUPIVACAINE HCL (PF) 0.5 % IJ SOLN
INTRAMUSCULAR | Status: AC
  Filled 2016-11-12: qty 10

## 2016-11-12 MED ORDER — SODIUM CHLORIDE 0.9 % IJ SOLN
INTRAMUSCULAR | Status: AC
  Filled 2016-11-12: qty 50

## 2016-11-12 MED ORDER — FENTANYL CITRATE (PF) 100 MCG/2ML IJ SOLN
INTRAMUSCULAR | Status: AC
  Filled 2016-11-12: qty 2

## 2016-11-12 MED ORDER — TAMSULOSIN HCL 0.4 MG PO CAPS
0.4000 mg | ORAL_CAPSULE | Freq: Every evening | ORAL | Status: DC
  Administered 2016-11-12 – 2016-11-13 (×2): 0.4 mg via ORAL
  Filled 2016-11-12 (×2): qty 1

## 2016-11-12 MED ORDER — AMLODIPINE BESYLATE 5 MG PO TABS
2.5000 mg | ORAL_TABLET | Freq: Every day | ORAL | Status: DC
  Administered 2016-11-13 – 2016-11-14 (×2): 2.5 mg via ORAL
  Filled 2016-11-12 (×2): qty 1

## 2016-11-12 MED ORDER — FENOFIBRATE 160 MG PO TABS
160.0000 mg | ORAL_TABLET | Freq: Every evening | ORAL | Status: DC
  Administered 2016-11-12 – 2016-11-13 (×2): 160 mg via ORAL
  Filled 2016-11-12 (×3): qty 1

## 2016-11-12 MED ORDER — PHENOL 1.4 % MT LIQD
1.0000 | OROMUCOSAL | Status: DC | PRN
  Filled 2016-11-12: qty 177

## 2016-11-12 MED ORDER — HYDROCHLOROTHIAZIDE 25 MG PO TABS
25.0000 mg | ORAL_TABLET | Freq: Every day | ORAL | Status: DC
  Administered 2016-11-13 – 2016-11-14 (×2): 25 mg via ORAL
  Filled 2016-11-12 (×3): qty 1

## 2016-11-12 MED ORDER — GLYCOPYRROLATE 0.2 MG/ML IJ SOLN
INTRAMUSCULAR | Status: DC | PRN
  Administered 2016-11-12: 0.2 mg via INTRAVENOUS

## 2016-11-12 MED ORDER — CEFAZOLIN SODIUM-DEXTROSE 2-4 GM/100ML-% IV SOLN
2.0000 g | Freq: Four times a day (QID) | INTRAVENOUS | Status: AC
  Administered 2016-11-12 – 2016-11-13 (×3): 2 g via INTRAVENOUS
  Filled 2016-11-12 (×5): qty 100

## 2016-11-12 MED ORDER — EPHEDRINE SULFATE 50 MG/ML IJ SOLN
INTRAMUSCULAR | Status: AC
  Filled 2016-11-12: qty 1

## 2016-11-12 MED ORDER — FAMOTIDINE 20 MG PO TABS
20.0000 mg | ORAL_TABLET | Freq: Once | ORAL | Status: AC
  Administered 2016-11-12: 20 mg via ORAL

## 2016-11-12 MED ORDER — ALUM & MAG HYDROXIDE-SIMETH 200-200-20 MG/5ML PO SUSP: 30.0000 mL | ORAL | Status: DC | PRN

## 2016-11-12 MED ORDER — NEOMYCIN-POLYMYXIN B GU 40-200000 IR SOLN
Status: AC
  Filled 2016-11-12: qty 20

## 2016-11-12 MED ORDER — FLEET ENEMA 7-19 GM/118ML RE ENEM: 1.0000 | ENEMA | Freq: Once | RECTAL | Status: DC | PRN

## 2016-11-12 MED ORDER — EPHEDRINE SULFATE 50 MG/ML IJ SOLN
INTRAMUSCULAR | Status: DC | PRN
  Administered 2016-11-12: 10 mg via INTRAVENOUS

## 2016-11-12 MED ORDER — LIDOCAINE HCL (PF) 2 % IJ SOLN
INTRAMUSCULAR | Status: DC | PRN
  Administered 2016-11-12: 50 mg

## 2016-11-12 MED ORDER — LIDOCAINE HCL 2 % EX GEL
CUTANEOUS | Status: DC | PRN
  Administered 2016-11-12: 1 via TOPICAL

## 2016-11-12 MED ORDER — BUPIVACAINE HCL (PF) 0.25 % IJ SOLN
INTRAMUSCULAR | Status: DC | PRN
  Administered 2016-11-12: 60 mL

## 2016-11-12 MED ORDER — MORPHINE SULFATE (PF) 2 MG/ML IV SOLN
2.0000 mg | INTRAVENOUS | Status: DC | PRN
Start: 2016-11-12 — End: 2016-11-14

## 2016-11-12 MED ORDER — ONDANSETRON HCL 4 MG/2ML IJ SOLN: 4.0000 mg | Freq: Four times a day (QID) | INTRAMUSCULAR | Status: DC | PRN

## 2016-11-12 MED ORDER — TRANEXAMIC ACID 1000 MG/10ML IV SOLN
1000.0000 mg | Freq: Once | INTRAVENOUS | Status: AC
  Administered 2016-11-12: 1000 mg via INTRAVENOUS
  Filled 2016-11-12: qty 10

## 2016-11-12 MED ORDER — BUPIVACAINE HCL (PF) 0.25 % IJ SOLN
INTRAMUSCULAR | Status: AC
  Filled 2016-11-12: qty 60

## 2016-11-12 SURGICAL SUPPLY — 71 items
BATTERY INSTRU NAVIGATION (MISCELLANEOUS) ×12 IMPLANT
BLADE CLIPPER SURG (BLADE) ×2 IMPLANT
BLADE SAW 1 (BLADE) ×3 IMPLANT
BLADE SAW 1/2 (BLADE) ×3 IMPLANT
BLADE SAW 70X12.5 (BLADE) IMPLANT
BNDG COHESIVE 4X5 TAN STRL (GAUZE/BANDAGES/DRESSINGS) ×2 IMPLANT
BTRY SRG DRVR LF (MISCELLANEOUS) ×4
CANISTER SUCT 1200ML W/VALVE (MISCELLANEOUS) ×3 IMPLANT
CANISTER SUCT 3000ML PPV (MISCELLANEOUS) ×6 IMPLANT
CAPT KNEE TOTAL 3 ATTUNE ×2 IMPLANT
CATH COUDE FOLEY 5CC 14FR (CATHETERS) ×2 IMPLANT
CATH TRAY METER 16FR LF (MISCELLANEOUS) ×3 IMPLANT
CEMENT HV SMART SET (Cement) ×6 IMPLANT
COOLER POLAR GLACIER W/PUMP (MISCELLANEOUS) ×3 IMPLANT
CUFF TOURN 24 STER (MISCELLANEOUS) IMPLANT
CUFF TOURN 30 STER DUAL PORT (MISCELLANEOUS) ×2 IMPLANT
DRAPE SHEET LG 3/4 BI-LAMINATE (DRAPES) ×3 IMPLANT
DRSG DERMACEA 8X12 NADH (GAUZE/BANDAGES/DRESSINGS) ×3 IMPLANT
DRSG OPSITE POSTOP 4X14 (GAUZE/BANDAGES/DRESSINGS) ×3 IMPLANT
DRSG TEGADERM 4X4.75 (GAUZE/BANDAGES/DRESSINGS) ×3 IMPLANT
DURAPREP 26ML APPLICATOR (WOUND CARE) ×4 IMPLANT
ELECT CAUTERY BLADE 6.4 (BLADE) ×3 IMPLANT
ELECT REM PT RETURN 9FT ADLT (ELECTROSURGICAL) ×3
ELECTRODE REM PT RTRN 9FT ADLT (ELECTROSURGICAL) ×1 IMPLANT
EX-PIN ORTHOLOCK NAV 4X150 (PIN) ×6 IMPLANT
GLOVE BIO SURGEON STRL SZ7 (GLOVE) ×4 IMPLANT
GLOVE BIOGEL M STRL SZ7.5 (GLOVE) ×6 IMPLANT
GLOVE BIOGEL PI IND STRL 7.0 (GLOVE) IMPLANT
GLOVE BIOGEL PI IND STRL 9 (GLOVE) ×1 IMPLANT
GLOVE BIOGEL PI INDICATOR 7.0 (GLOVE) ×4
GLOVE BIOGEL PI INDICATOR 9 (GLOVE) ×4
GLOVE INDICATOR 8.0 STRL GRN (GLOVE) ×9 IMPLANT
GLOVE SURG SYN 9.0  PF PI (GLOVE) ×8
GLOVE SURG SYN 9.0 PF PI (GLOVE) ×1 IMPLANT
GOWN STRL REUS W/ TWL LRG LVL3 (GOWN DISPOSABLE) ×2 IMPLANT
GOWN STRL REUS W/TWL 2XL LVL3 (GOWN DISPOSABLE) ×3 IMPLANT
GOWN STRL REUS W/TWL LRG LVL3 (GOWN DISPOSABLE) ×9
HEMOVAC 400CC 10FR (MISCELLANEOUS) ×3 IMPLANT
HOLDER FOLEY CATH W/STRAP (MISCELLANEOUS) ×3 IMPLANT
HOOD PEEL AWAY FLYTE STAYCOOL (MISCELLANEOUS) ×6 IMPLANT
KIT RM TURNOVER STRD PROC AR (KITS) ×3 IMPLANT
KNIFE SCULPS 14X20 (INSTRUMENTS) ×3 IMPLANT
LABEL OR SOLS (LABEL) ×3 IMPLANT
NDL SAFETY 18GX1.5 (NEEDLE) ×3 IMPLANT
NDL SPNL 20GX3.5 QUINCKE YW (NEEDLE) ×1 IMPLANT
NEEDLE SPNL 20GX3.5 QUINCKE YW (NEEDLE) ×3 IMPLANT
NS IRRIG 500ML POUR BTL (IV SOLUTION) ×3 IMPLANT
PACK TOTAL KNEE (MISCELLANEOUS) ×3 IMPLANT
PAD WRAPON POLAR KNEE (MISCELLANEOUS) ×1 IMPLANT
PIN DRILL QUICK PACK ×3 IMPLANT
PIN FIXATION 1/8DIA X 3INL (PIN) ×3 IMPLANT
PULSAVAC PLUS IRRIG FAN TIP (DISPOSABLE) ×3
SOL .9 NS 3000ML IRR  AL (IV SOLUTION) ×2
SOL .9 NS 3000ML IRR AL (IV SOLUTION) ×1
SOL .9 NS 3000ML IRR UROMATIC (IV SOLUTION) ×1 IMPLANT
SOL PREP PVP 2OZ (MISCELLANEOUS) ×3
SOLUTION PREP PVP 2OZ (MISCELLANEOUS) ×1 IMPLANT
SPONGE DRAIN TRACH 4X4 STRL 2S (GAUZE/BANDAGES/DRESSINGS) ×3 IMPLANT
STAPLER SKIN PROX 35W (STAPLE) ×3 IMPLANT
STRAP TIBIA SHORT (MISCELLANEOUS) ×3 IMPLANT
SUCTION FRAZIER HANDLE 10FR (MISCELLANEOUS) ×2
SUCTION TUBE FRAZIER 10FR DISP (MISCELLANEOUS) ×1 IMPLANT
SUT VIC AB 0 CT1 36 (SUTURE) ×3 IMPLANT
SUT VIC AB 1 CT1 36 (SUTURE) ×6 IMPLANT
SUT VIC AB 2-0 CT2 27 (SUTURE) ×3 IMPLANT
SYR 20CC LL (SYRINGE) ×3 IMPLANT
SYR 30ML LL (SYRINGE) ×6 IMPLANT
TIP FAN IRRIG PULSAVAC PLUS (DISPOSABLE) ×1 IMPLANT
TOWEL OR 17X26 4PK STRL BLUE (TOWEL DISPOSABLE) ×3 IMPLANT
TOWER CARTRIDGE SMART MIX (DISPOSABLE) ×3 IMPLANT
WRAPON POLAR PAD KNEE (MISCELLANEOUS) ×3

## 2016-11-12 NOTE — Anesthesia Procedure Notes (Signed)
Spinal  Patient location during procedure: OR Staffing Anesthesiologist: Molli Barrows Resident/CRNA: Rolla Plate Performed: resident/CRNA  Preanesthetic Checklist Completed: patient identified, site marked, surgical consent, pre-op evaluation, timeout performed, IV checked, risks and benefits discussed and monitors and equipment checked Spinal Block Patient position: sitting Prep: ChloraPrep and site prepped and draped Patient monitoring: heart rate, continuous pulse ox, blood pressure and cardiac monitor Approach: midline Location: L4-5 Injection technique: single-shot Needle Needle type: Introducer and Pencan  Needle gauge: 24 G Needle length: 9 cm Assessment Sensory level: T10 Additional Notes Negative paresthesia. Negative blood return. Positive free-flowing CSF. Expiration date of kit checked and confirmed. Patient tolerated procedure well, without complications.

## 2016-11-12 NOTE — Progress Notes (Signed)
Admission:  Patient alert and oriented, Complaining of minimal pain. Patient up to recliner. Denies nausea, advanced to regular diet. Dressing clean and intact. Lung and heart sounds normal. Patient oriented to room and call bell system.  Deri Fuelling, RN

## 2016-11-12 NOTE — Anesthesia Preprocedure Evaluation (Signed)
Anesthesia Evaluation  Patient identified by MRN, date of birth, ID band Patient awake    Reviewed: Allergy & Precautions, H&P , NPO status , Patient's Chart, lab work & pertinent test results, reviewed documented beta blocker date and time   Airway Mallampati: II   Neck ROM: full    Dental  (+) Teeth Intact   Pulmonary neg pulmonary ROS, former smoker,    Pulmonary exam normal        Cardiovascular hypertension, negative cardio ROS Normal cardiovascular exam Rhythm:regular Rate:Normal     Neuro/Psych  Neuromuscular disease negative neurological ROS  negative psych ROS   GI/Hepatic negative GI ROS, Neg liver ROS,   Endo/Other  negative endocrine ROS  Renal/GU negative Renal ROS  negative genitourinary   Musculoskeletal   Abdominal   Peds  Hematology negative hematology ROS (+)   Anesthesia Other Findings Past Medical History: No date: Arthritis No date: Diverticulosis     Comment:  last attack 04/2011 No date: Elevated lipids No date: Hypertension     Comment:  dr  Clair Gulling   [place]    [place] No date: Neuropathy No date: Peripheral neuropathy No date: Prostate enlargement Past Surgical History: No date: CARDIAC CATHETERIZATION     Comment:  87     stress test   87 No date: COLON SURGERY     Comment:  part of colon removed,diseased No date: HERNIA REPAIR 07/26/2011: LUMBAR LAMINECTOMY/DECOMPRESSION MICRODISCECTOMY     Comment:  Procedure: LUMBAR LAMINECTOMY/DECOMPRESSION               MICRODISCECTOMY 2 LEVELS;  Surgeon: Ophelia Charter,               MD;  Location: [facility];  Service: Neurosurgery;                Laterality: Bilateral;  Lumbar two to four               Laminectomies  No date: MELANOMA EXCISION     Comment:  lt arm No date: orthrocsopy  lt knee No date: TONSILLECTOMY BMI    Body Mass Index:  28.70 kg/m     Reproductive/Obstetrics negative OB ROS                              Anesthesia Physical Anesthesia Plan  ASA: II  Anesthesia Plan: Spinal   Post-op Pain Management:    Induction:   PONV Risk Score and Plan: 3 and Ondansetron, Dexamethasone, Midazolam and Propofol infusion  Airway Management Planned:   Additional Equipment:   Intra-op Plan:   Post-operative Plan:   Informed Consent: I have reviewed the patients History and Physical, chart, labs and discussed the procedure including the risks, benefits and alternatives for the proposed anesthesia with the patient or authorized representative who has indicated his/her understanding and acceptance.   Dental Advisory Given  Plan Discussed with: CRNA  Anesthesia Plan Comments:         Anesthesia Quick Evaluation

## 2016-11-12 NOTE — Op Note (Signed)
OPERATIVE NOTE  DATE OF SURGERY:  11/12/2016  PATIENT NAME:  Mark Cowan   DOB: 10/05/1933  MRN: [id number]  PRE-OPERATIVE DIAGNOSIS: Degenerative arthrosis of the left knee, primary  POST-OPERATIVE DIAGNOSIS:  Same  PROCEDURE:  Left total knee arthroplasty using computer-assisted navigation  SURGEON:  Marciano Sequin. M.D.  ASSISTANT:  Vance Peper, PA (present and scrubbed throughout the case, critical for assistance with exposure, retraction, instrumentation, and closure)  ANESTHESIA: spinal  ESTIMATED BLOOD LOSS: 50 mL  FLUIDS REPLACED: 1500 mL of crystalloid  TOURNIQUET TIME: 84 minutes  DRAINS: 2 medium Hemovac drains  SOFT TISSUE RELEASES: Anterior cruciate ligament, posterior cruciate ligament, deep medial collateral ligament, patellofemoral ligament  IMPLANTS UTILIZED: DePuy Attune size 7 posterior stabilized femoral component (cemented), size 7 rotating platform tibial component (cemented), 38 mm medialized dome patella (cemented), and a 5 mm stabilized rotating platform polyethylene insert.  INDICATIONS FOR SURGERY: Mark Cowan is a 83 y.o. year old male with a long history of progressive knee pain. X-rays demonstrated severe degenerative changes in tricompartmental fashion. The patient had not seen any significant improvement despite conservative nonsurgical intervention. After discussion of the risks and benefits of surgical intervention, the patient expressed understanding of the risks benefits and agree with plans for total knee arthroplasty.   The risks, benefits, and alternatives were discussed at length including but not limited to the risks of infection, bleeding, nerve injury, stiffness, blood clots, the need for revision surgery, cardiopulmonary complications, among others, and they were willing to proceed.  PROCEDURE IN DETAIL: The patient was brought into the operating room and, after adequate spinal anesthesia was achieved, a tourniquet was placed on  the patient's upper thigh. The patient's knee and leg were cleaned and prepped with alcohol and DuraPrep and draped in the usual sterile fashion. A "timeout" was performed as per usual protocol. The lower extremity was exsanguinated using an Esmarch, and the tourniquet was inflated to 300 mmHg. An anterior longitudinal incision was made followed by a standard mid vastus approach. The deep fibers of the medial collateral ligament were elevated in a subperiosteal fashion off of the medial flare of the tibia so as to maintain a continuous soft tissue sleeve. The patella was subluxed laterally and the patellofemoral ligament was incised. Inspection of the knee demonstrated severe degenerative changes with full-thickness loss of articular cartilage. Osteophytes were debrided using a rongeur. Anterior and posterior cruciate ligaments were excised. Two 4.0 mm Schanz pins were inserted in the femur and into the tibia for attachment of the array of trackers used for computer-assisted navigation. Hip center was identified using a circumduction technique. Distal landmarks were mapped using the computer. The distal femur and proximal tibia were mapped using the computer. The distal femoral cutting guide was positioned using computer-assisted navigation so as to achieve a 5 distal valgus cut. The femur was sized and it was felt that a size 7 femoral component was appropriate. A size 7 femoral cutting guide was positioned and the anterior cut was performed and verified using the computer. This was followed by completion of the posterior and chamfer cuts. Femoral cutting guide for the central box was then positioned in the center box cut was performed.  Attention was then directed to the proximal tibia. Medial and lateral menisci were excised. The extramedullary tibial cutting guide was positioned using computer-assisted navigation so as to achieve a 0 varus-valgus alignment and 3 posterior slope. The cut was performed and  verified using the computer. The proximal tibia  was sized and it was felt that a size 7 tibial tray was appropriate. Tibial and femoral trials were inserted followed by insertion of a 5 mm polyethylene insert. This allowed for excellent mediolateral soft tissue balancing both in flexion and in full extension. Finally, the patella was cut and prepared so as to accommodate a 38 mm medialized dome patella. A patella trial was placed and the knee was placed through a range of motion with excellent patellar tracking appreciated. The femoral trial was removed after debridement of posterior osteophytes. The central post-hole for the tibial component was reamed followed by insertion of a keel punch. Tibial trials were then removed. Cut surfaces of bone were irrigated with copious amounts of normal saline with antibiotic solution using pulsatile lavage and then suctioned dry. Polymethylmethacrylate cement was prepared in the usual fashion using a vacuum mixer. Cement was applied to the cut surface of the proximal tibia as well as along the undersurface of a size 7 rotating platform tibial component. Tibial component was positioned and impacted into place. Excess cement was removed using [employer] fast streamer. Cement was then applied to the cut surfaces of the femur as well as along the posterior flanges of the size 7 femoral component. The femoral component was positioned and impacted into place. Excess cement was removed using [employer] fast streamer. A 5 mm polyethylene trial was inserted and the knee was brought into full extension with steady axial compression applied. Finally, cement was applied to the backside of a 38 mm medialized dome patella and the patellar component was positioned and patellar clamp applied. Excess cement was removed using [employer] fast streamer. After adequate curing of the cement, the tourniquet was deflated after a total tourniquet time of 84 minutes. Hemostasis was achieved using electrocautery. The knee was  irrigated with copious amounts of normal saline with antibiotic solution using pulsatile lavage and then suctioned dry. 20 mL of 1.3% Exparel and 60 mL of 0.25% Marcaine in 40 mL of normal saline was injected along the posterior capsule, medial and lateral gutters, and along the arthrotomy site. A 5 mm stabilized rotating platform polyethylene insert was inserted and the knee was placed through a range of motion with excellent mediolateral soft tissue balancing appreciated and excellent patellar tracking noted. 2 medium drains were placed in the wound bed and brought out through separate stab incisions. The medial parapatellar portion of the incision was reapproximated using interrupted sutures of #1 Vicryl. Subcutaneous tissue was approximated in layers using first #0 Vicryl followed #2-0 Vicryl. The skin was approximated with skin staples. A sterile dressing was applied.  The patient tolerated the procedure well and was transported to the recovery room in stable condition.    James P. Holley Bouche., M.D.

## 2016-11-12 NOTE — H&P (Signed)
The patient has been re-examined, and the chart reviewed, and there have been no interval changes to the documented history and physical.    The risks, benefits, and alternatives have been discussed at length. The patient expressed understanding of the risks benefits and agreed with plans for surgical intervention.  James P. Hooten, Jr. M.D.    

## 2016-11-12 NOTE — Transfer of Care (Signed)
Immediate Anesthesia Transfer of Care Note  Patient: Mark Cowan  Procedure(s) Performed: Procedure(s): COMPUTER ASSISTED TOTAL KNEE ARTHROPLASTY (Left)  Patient Location: PACU  Anesthesia Type:Spinal  Level of Consciousness: awake and alert   Airway & Oxygen Therapy: Patient Spontanous Breathing and Patient connected to face mask oxygen  Post-op Assessment: Report given to RN and Post -op Vital signs reviewed and stable  Post vital signs: Reviewed  Last Vitals:  Vitals:   11/12/16 1037 11/12/16 1038  BP: (!) 111/57 (!) 111/57  Pulse: (!) 56 (!) 56  Resp: 16 18  Temp: 36.6 C 36.6 C    Last Pain:  Vitals:   11/12/16 0609  TempSrc: Tympanic         Complications: No apparent anesthesia complications

## 2016-11-12 NOTE — Anesthesia Post-op Follow-up Note (Cosign Needed)
Anesthesia QCDR form completed.        

## 2016-11-12 NOTE — Evaluation (Signed)
Physical Therapy Evaluation Patient Details Name: Mark Cowan MRN: [id number] DOB: Oct 25, 1933 Today's Date: 11/12/2016   History of Present Illness  Pt underwent L TKR and is POD#0 at time of initial evaluation. No reported post-op complications. PMH includes OA, HTN, hyperlipidemia, and back surgery  Clinical Impression  Pt admitted with above diagnosis. Pt currently with functional limitations due to the deficits listed below (see PT Problem List).  Pt requires supervision only for bed mobility and CGA for transfers and limited ambulation from bed to recliner. He is able to complete supine exercises as instructed by therapist with minimal increase in pain. AAROM is -2 to 88 degrees and primarily limited by pain with respect to flexion. Pt will need a rolling walker and HH PT at discharge. Pt will benefit from PT services to address deficits in strength, balance, and mobility in order to return to full function at home.     Follow Up Recommendations Home health PT    Equipment Recommendations  Rolling walker with 5" wheels    Recommendations for Other Services       Precautions / Restrictions Precautions Precautions: Fall Restrictions Weight Bearing Restrictions: Yes LLE Weight Bearing: Weight bearing as tolerated      Mobility  Bed Mobility Overal bed mobility: Needs Assistance Bed Mobility: Supine to Sit     Supine to sit: Supervision     General bed mobility comments: Pt requires cues for sequencing but no external support. HOB elevated and bed rail utilized  Transfers Overall transfer level: Needs assistance Equipment used: Rolling walker (2 wheeled) Transfers: Sit to/from Stand Sit to Stand: Min guard         General transfer comment: Pt provided cues for safe hand placement during transfer. Requires increased time. Once upright pt is steady with UE support on rolling walker  Ambulation/Gait Ambulation/Gait assistance: Min guard Ambulation Distance  (Feet): 5 Feet Assistive device: Rolling walker (2 wheeled) Gait Pattern/deviations: Step-to pattern Gait velocity: Decreased Gait velocity interpretation: <1.8 ft/sec, indicative of risk for recurrent falls General Gait Details: Pt provided education and cues for proper sequencing with rolling walker. He is able to take short steps to ambulate from bed to recliner. Good stability with UE support on rolling walker  Stairs            Wheelchair Mobility    Modified Rankin (Stroke Patients Only)       Balance Overall balance assessment: Needs assistance Sitting-balance support: No upper extremity supported Sitting balance-Leahy Scale: Good     Standing balance support: Bilateral upper extremity supported Standing balance-Leahy Scale: Fair Standing balance comment: Support on rolling walker                             Pertinent Vitals/Pain Pain Assessment: 0-10 Pain Score: 4  Pain Location: L knee Pain Descriptors / Indicators: Operative site guarding Pain Intervention(s): Monitored during session;Premedicated before session    Home Living Family/patient expects to be discharged to:: Private residence Living Arrangements: Spouse/significant other Available Help at Discharge: Family Type of Home: House Home Access: Stairs to enter Entrance Stairs-Rails: None (Can hold onto two "lions" beside the door) Entrance Stairs-Number of Steps: 2 Home Layout: One level Home Equipment: Cane - single point;Grab bars - tub/shower (No walker)      Prior Function Level of Independence: Independent         Comments: Independent with ADLs/IADLs. No falls. Ambulates community distances without assistive device  Hand Dominance   Dominant Hand: Right    Extremity/Trunk Assessment   Upper Extremity Assessment Upper Extremity Assessment: Overall WFL for tasks assessed    Lower Extremity Assessment Lower Extremity Assessment: LLE deficits/detail LLE Deficits  / Details: Able to perform SLR and SAQ without assistance. Reports intact sensation to LLE. Full DF/PF       Communication   Communication: No difficulties  Cognition Arousal/Alertness: Awake/alert Behavior During Therapy: WFL for tasks assessed/performed Overall Cognitive Status: Within Functional Limits for tasks assessed                                        General Comments      Exercises Total Joint Exercises Ankle Circles/Pumps: AROM;Both;10 reps;Supine Quad Sets: Strengthening;Both;10 reps;Supine Gluteal Sets: Strengthening;Both;10 reps;Supine Towel Squeeze: Strengthening;Both;10 reps;Supine Short Arc Quad: Strengthening;Left;10 reps;Supine Heel Slides: Strengthening;Left;10 reps;Supine Hip ABduction/ADduction: Strengthening;Left;10 reps;Supine Straight Leg Raises: Strengthening;Left;10 reps;Supine Goniometric ROM: -2 to 88 degrees AAROM, pain limited   Assessment/Plan    PT Assessment Patient needs continued PT services  PT Problem List Decreased strength;Decreased range of motion;Decreased mobility;Decreased balance;Decreased knowledge of use of DME;Pain       PT Treatment Interventions DME instruction;Gait training;Stair training;Functional mobility training;Therapeutic activities;Balance training;Therapeutic exercise;Neuromuscular re-education;Patient/family education;Manual techniques    PT Goals (Current goals can be found in the Care Plan section)  Acute Rehab PT Goals Patient Stated Goal: Return to prior function with less pain PT Goal Formulation: With patient/family Time For Goal Achievement: 11/26/16 Potential to Achieve Goals: Good    Frequency BID   Barriers to discharge        Co-evaluation               AM-PAC PT "6 Clicks" Daily Activity  Outcome Measure Difficulty turning over in bed (including adjusting bedclothes, sheets and blankets)?: A Little Difficulty moving from lying on back to sitting on the side of the  bed? : A Little Difficulty sitting down on and standing up from a chair with arms (e.g., wheelchair, bedside commode, etc,.)?: A Little Help needed moving to and from a bed to chair (including a wheelchair)?: A Little Help needed walking in hospital room?: A Little Help needed climbing 3-5 steps with a railing? : A Lot 6 Click Score: 17    End of Session Equipment Utilized During Treatment: Gait belt Activity Tolerance: Patient tolerated treatment well Patient left: in chair;with call bell/phone within reach;with chair alarm set;with SCD's reapplied;Other (comment) (towel roll under heel, polar care in place) Nurse Communication: Mobility status;Other (comment) (Mobility status written on dry erase board) PT Visit Diagnosis: Unsteadiness on feet (R26.81);Muscle weakness (generalized) (M62.81);Pain Pain - Right/Left: Left Pain - part of body: Knee    Time: 1510-1543 PT Time Calculation (min) (ACUTE ONLY): 33 min   Charges:   PT Evaluation $PT Eval Low Complexity: 1 Low PT Treatments $Therapeutic Exercise: 8-22 mins   PT G Codes:   PT G-Codes **NOT FOR INPATIENT CLASS** Functional Assessment Tool Used: AM-PAC 6 Clicks Basic Mobility Functional Limitation: Mobility: Walking and moving around Mobility: Walking and Moving Around Current Status (J5701): At least 40 percent but less than 60 percent impaired, limited or restricted Mobility: Walking and Moving Around Goal Status [phone]): At least 1 percent but less than 20 percent impaired, limited or restricted    Mark Cowan PT, DPT    Mark Cowan 11/12/2016, 4:49 PM

## 2016-11-12 NOTE — NC FL2 (Signed)
[place] LEVEL OF CARE SCREENING TOOL     IDENTIFICATION  Patient Name: Mark Cowan Birthdate: 1934-01-12 Sex: male Admission Date (Current Location): 11/12/2016  [place] and [place] Number:  Engineering geologist and Address:  [facility], [address] [place], [place] [address]      Provider Number: [id number]  Attending Physician Name and Address:  Dereck Leep, MD  Relative Name and Phone Number:       Current Level of Care: Hospital Recommended Level of Care: [place] Prior Approval Number:    Date Approved/Denied:   PASRR Number:  [id number])  Discharge Plan: SNF    Current Diagnoses: Patient Active Problem List   Diagnosis Date Noted  . S/P total knee arthroplasty 11/12/2016  . Diverticulitis of colon (without mention of hemorrhage)(562.11) 11/04/2012  . Screening for colon cancer 11/04/2012    Orientation RESPIRATION BLADDER Height & Weight     Self, Time, Situation, Place  O2 (2 Liters Oxygen ) Continent Weight: 200 lb (90.7 kg) Height:  5\' 10"  (177.8 cm)  BEHAVIORAL SYMPTOMS/MOOD NEUROLOGICAL BOWEL NUTRITION STATUS   (none)  (none) Continent Diet (Diet: Clear Liquid )  AMBULATORY STATUS COMMUNICATION OF NEEDS Skin   Extensive Assist  (none) Surgical wounds (Incision: Left Knee )                       Personal Care Assistance Level of Assistance  Bathing, Feeding, Dressing Bathing Assistance: Limited assistance Feeding assistance: Independent Dressing Assistance: Limited assistance     Functional Limitations Info  Sight, Hearing, Speech Sight Info: Adequate Hearing Info: Adequate Speech Info: Adequate    SPECIAL CARE FACTORS FREQUENCY  PT (By licensed PT), OT (By licensed OT)     PT Frequency:  (5) OT Frequency:  (5)            Contractures      Additional Factors Info  Code Status, Allergies Code Status Info:  (Full Code. ) Allergies Info:  (Statins)            Current Medications (11/12/2016):  This is the current hospital active medication list Current Facility-Administered Medications  Medication Dose Route Frequency Provider Last Rate Last Dose  . 0.9 %  sodium chloride infusion   Intravenous Continuous Siriah Treat, Laurice Record, MD 100 mL/hr at 11/12/16 1200    . acetaminophen (OFIRMEV) IV 1,000 mg  1,000 mg Intravenous Q6H Elhadj Girton, Laurice Record, MD      . acetaminophen (TYLENOL) tablet 650 mg  650 mg Oral Q6H PRN Michell Kader, Laurice Record, MD       Or  . acetaminophen (TYLENOL) suppository 650 mg  650 mg Rectal Q6H PRN Rogerick Baldwin, Laurice Record, MD      . alum & mag hydroxide-simeth (MAALOX/MYLANTA) 200-200-20 MG/5ML suspension 30 mL  30 mL Oral Q4H PRN Vail Basista, Laurice Record, MD      . amLODipine (NORVASC) tablet 2.5 mg  2.5 mg Oral Daily Aalina Brege, Laurice Record, MD      . bisacodyl (DULCOLAX) suppository 10 mg  10 mg Rectal Daily PRN Kaleeyah Cuffie, Laurice Record, MD      . ceFAZolin (ANCEF) IVPB 2g/100 mL premix  2 g Intravenous Q6H Patryk Conant, Laurice Record, MD   Stopped at 11/12/16 1319  . celecoxib (CELEBREX) capsule 200 mg  200 mg Oral Q12H Niv Darley, Laurice Record, MD   200 mg at 11/12/16 1232  . cholecalciferol (VITAMIN D) tablet 2,000 Units  2,000 Units Oral Daily  Tanisia Yokley, Laurice Record, MD      . diphenhydrAMINE (BENADRYL) 12.5 MG/5ML elixir 12.5-25 mg  12.5-25 mg Oral Q4H PRN Shanekia Latella, Laurice Record, MD      . dutasteride (AVODART) capsule 0.5 mg  0.5 mg Oral Daily Makayla Confer, Laurice Record, MD      . Derrill Memo ON 11/13/2016] enoxaparin (LOVENOX) injection 30 mg  30 mg Subcutaneous Q12H Jadene Stemmer, Laurice Record, MD      . fenofibrate tablet 160 mg  160 mg Oral QPM Toron Bowring, Laurice Record, MD      . fentaNYL (SUBLIMAZE) 100 MCG/2ML injection           . ferrous sulfate tablet 325 mg  325 mg Oral BID WC Hatley Henegar, Laurice Record, MD      . gabapentin (NEURONTIN) capsule 300 mg  300 mg Oral QPM Tana Trefry, Laurice Record, MD      . glycopyrrolate (ROBINUL) 0.2 MG/ML injection           . glycopyrrolate (ROBINUL) 0.2 MG/ML injection           . hydrochlorothiazide (HYDRODIURIL)  tablet 25 mg  25 mg Oral Daily Mishti Swanton, Laurice Record, MD      . losartan (COZAAR) tablet 100 mg  100 mg Oral Daily Preeti Winegardner, Laurice Record, MD      . magnesium hydroxide (MILK OF MAGNESIA) suspension 30 mL  30 mL Oral Daily PRN Lynzie Cliburn, Laurice Record, MD      . menthol-cetylpyridinium (CEPACOL) lozenge 3 mg  1 lozenge Oral PRN Boyd Buffalo, Laurice Record, MD       Or  . phenol (CHLORASEPTIC) mouth spray 1 spray  1 spray Mouth/Throat PRN Demitrus Francisco, Laurice Record, MD      . metoCLOPramide (REGLAN) tablet 10 mg  10 mg Oral TID AC & HS Jhene Westmoreland, Laurice Record, MD   10 mg at 11/12/16 1232  . morphine 2 MG/ML injection 2 mg  2 mg Intravenous Q2H PRN Teniyah Seivert, Laurice Record, MD      . omega-3 acid ethyl esters (LOVAZA) capsule 1 g  1 g Oral BID Gared Gillie, Laurice Record, MD      . ondansetron (ZOFRAN) tablet 4 mg  4 mg Oral Q6H PRN Maisey Deandrade, Laurice Record, MD       Or  . ondansetron (ZOFRAN) injection 4 mg  4 mg Intravenous Q6H PRN Gar Glance, Laurice Record, MD      . oxyCODONE (Oxy IR/ROXICODONE) immediate release tablet 5-10 mg  5-10 mg Oral Q4H PRN Dereck Leep, MD   10 mg at 11/12/16 1232  . pantoprazole (PROTONIX) EC tablet 40 mg  40 mg Oral BID Dereck Leep, MD      . Derrill Memo ON 11/13/2016] rosuvastatin (CRESTOR) tablet 10 mg  10 mg Oral 2 times per day on Tue Fri Johannes Everage, Laurice Record, MD      . senna-docusate (Senokot-S) tablet 1 tablet  1 tablet Oral BID Dereck Leep, MD   1 tablet at 11/12/16 1232  . sodium phosphate (FLEET) 7-19 GM/118ML enema 1 enema  1 enema Rectal Once PRN Jodean Valade, Laurice Record, MD      . tamsulosin (FLOMAX) capsule 0.4 mg  0.4 mg Oral QPM Antonius Hartlage, Laurice Record, MD      . traMADol Veatrice Bourbon) tablet 50-100 mg  50-100 mg Oral Q4H PRN Dereck Leep, MD   100 mg at 11/12/16 1354  . vitamin B-12 (CYANOCOBALAMIN) tablet 2,500 mcg  2,500 mcg Oral Daily Zamiyah Resendes, Laurice Record, MD      . vitamin C (  ASCORBIC ACID) tablet 500 mg  500 mg Oral Daily Erice Ahles, Laurice Record, MD         Discharge Medications: Please see discharge summary for a list of discharge medications.  Relevant  Imaging Results:  Relevant Lab Results:   Additional Information  (SSN: [id number])  Sample, Veronia Beets, LCSW

## 2016-11-13 LAB — CBC
HCT: 34.7 % — ABNORMAL LOW (ref 40.0–52.0)
Hemoglobin: 12.1 g/dL — ABNORMAL LOW (ref 13.0–18.0)
MCH: 30.5 pg (ref 26.0–34.0)
MCHC: 34.9 g/dL (ref 32.0–36.0)
MCV: 87.3 fL (ref 80.0–100.0)
PLATELETS: 187 10*3/uL (ref 150–440)
RBC: 3.97 MIL/uL — ABNORMAL LOW (ref 4.40–5.90)
RDW: 13.1 % (ref 11.5–14.5)
WBC: 7.6 10*3/uL (ref 3.8–10.6)

## 2016-11-13 LAB — BASIC METABOLIC PANEL
Anion gap: 7 (ref 5–15)
BUN: 19 mg/dL (ref 6–20)
CO2: 26 mmol/L (ref 22–32)
CREATININE: 1.09 mg/dL (ref 0.61–1.24)
Calcium: 8.7 mg/dL — ABNORMAL LOW (ref 8.9–10.3)
Chloride: 106 mmol/L (ref 101–111)
GFR calc Af Amer: >60 mL/min (ref >60)
GLUCOSE: 107 mg/dL — AB (ref 65–99)
Potassium: 3.6 mmol/L (ref 3.5–5.1)
SODIUM: 139 mmol/L (ref 135–145)

## 2016-11-13 NOTE — Progress Notes (Signed)
Physical Therapy Treatment Patient Details Name: Mark Cowan MRN: [id number] DOB: August 09, 1933 Today's Date: 11/13/2016    History of Present Illness Pt. is an 83 y.o. male who was admitted to [facility] with a left TKR. Pt. PMHx includes: OA, Hyperlipidemia, and back surgery.    PT Comments    Participated in exercises as described below.  To edge of bed with supervision.  Some c/o dizziness with sitting but pt reports "Not bad".  He was able to stand and ambulate around unit x 1 with walker and min guard and stand at commode to void.  Overall gait quality and mobility continue to improve with no LOB or buckling but pt impulsive at times and hurries.  Verbal cues and education to slow down.  Pt's wife and daughter in and stated "He's always like that".  Will continue with goals and address stair training next session.   Follow Up Recommendations  Home health PT     Equipment Recommendations  Rolling walker with 5" wheels    Recommendations for Other Services       Precautions / Restrictions Precautions Precautions: Fall Restrictions Weight Bearing Restrictions: Yes LLE Weight Bearing: Weight bearing as tolerated    Mobility  Bed Mobility Overal bed mobility: Needs Assistance Bed Mobility: Sit to Supine     Supine to sit: Supervision     General bed mobility comments: Pt requires cues for sequencing but no external support. HOB elevated and bed rail utilized  Transfers Overall transfer level: Needs assistance Equipment used: Rolling walker (2 wheeled) Transfers: Sit to/from Stand Sit to Stand: Min guard;Min assist         General transfer comment: Pt provided cues for safe hand placement during transfer. Requires increased time. Once upright pt is steady with UE support on rolling walker  Ambulation/Gait Ambulation/Gait assistance: Min guard;Min assist Ambulation Distance (Feet): 160 Feet Assistive device: Rolling walker (2 wheeled) Gait Pattern/deviations:  Step-through pattern Gait velocity: Decreased Gait velocity interpretation: Below normal speed for age/gender General Gait Details: irregular pattern with verbal cues for walker positioning   Stairs            Wheelchair Mobility    Modified Rankin (Stroke Patients Only)       Balance Overall balance assessment: Needs assistance Sitting-balance support: No upper extremity supported Sitting balance-Leahy Scale: Good     Standing balance support: Bilateral upper extremity supported Standing balance-Leahy Scale: Fair Standing balance comment: Support on rolling walker                            Cognition Arousal/Alertness: Awake/alert Behavior During Therapy: WFL for tasks assessed/performed Overall Cognitive Status: Within Functional Limits for tasks assessed                                        Exercises Total Joint Exercises Ankle Circles/Pumps: AROM;Both;10 reps;Supine Quad Sets: Strengthening;Both;10 reps;Supine Gluteal Sets: Strengthening;Both;10 reps;Supine Heel Slides: Strengthening;Left;10 reps;Supine Hip ABduction/ADduction: Strengthening;Left;10 reps;Supine Straight Leg Raises: Strengthening;Left;10 reps;Supine Long Arc Quad: Strengthening;Seated;Left;10 reps Knee Flexion: AAROM;Seated;Left;10 reps Goniometric ROM: 2-90 Marching in Standing: AROM;Left;Standing;10 reps Other Exercises Other Exercises: standing at commode to void     General Comments        Pertinent Vitals/Pain Pain Assessment: 0-10 Pain Score: 3  Pain Location: L knee after gait Pain Descriptors / Indicators: Operative site guarding Pain Intervention(s):  Limited activity within patient's tolerance;Monitored during session;Ice applied    Home Living                      Prior Function            PT Goals (current goals can now be found in the care plan section) Progress towards PT goals: Progressing toward goals    Frequency     BID      PT Plan Current plan remains appropriate    Co-evaluation              AM-PAC PT "6 Clicks" Daily Activity  Outcome Measure  Difficulty turning over in bed (including adjusting bedclothes, sheets and blankets)?: A Little Difficulty moving from lying on back to sitting on the side of the bed? : A Little Difficulty sitting down on and standing up from a chair with arms (e.g., wheelchair, bedside commode, etc,.)?: A Little Help needed moving to and from a bed to chair (including a wheelchair)?: A Little Help needed walking in hospital room?: A Little Help needed climbing 3-5 steps with a railing? : A Lot 6 Click Score: 17    End of Session Equipment Utilized During Treatment: Gait belt Activity Tolerance: Patient tolerated treatment well Patient left: with call bell/phone within reach;with family/visitor present;in chair;with chair alarm set   Pain - Right/Left: Left Pain - part of body: Knee     Time: 1350-1403 PT Time Calculation (min) (ACUTE ONLY): 13 min  Charges:  $Gait Training: 8-22 mins $Therapeutic Exercise: 8-22 mins                    G Codes:       Chesley Noon, PTA 11/13/16, 2:12 PM

## 2016-11-13 NOTE — Care Management Note (Signed)
Case Management Note  Patient Details  Name: Mark Cowan MRN: [id number] Date of Birth: Apr 18, 1933  Subjective/Objective:  POD # 1 left TKA. Met with patient at bedside. He lives at home with his spouse who will be his caregiver. He will need a walker. Ordered from [facility]. Offered choice of home health agencies. Referral to [facility] for [facility]. [facility]acy: [facility]) [phone]. Call Lovenox 40 mg # 14 no refills. PCP is Dr. Maryland Pink.  Last seen in may 2018.                   Action/Plan: [facility] for HHPT, Lovenox called in. Walker ordered.   Expected Discharge Date:                  Expected Discharge Plan:  [place]  In-House Referral:     Discharge planning Services  CM Consult  Post Acute Care Choice:  Durable Medical Equipment, Home Health Choice offered to:  Patient  DME Arranged:  Walker rolling DME Agency:  [place]:    [place]:  [facility]  Status of Service:  In process, will continue to follow  If discussed at Long Length of Stay Meetings, dates discussed:    Additional Comments:  Jolly Mango, RN 11/13/2016, 8:31 AM

## 2016-11-13 NOTE — Progress Notes (Signed)
   Subjective: 1 Day Post-Op Procedure(s) (LRB): COMPUTER ASSISTED TOTAL KNEE ARTHROPLASTY (Left) Patient reports pain as moderate.   Patient is well, and has had no acute complaints or problems We will start therapy today. ROM 0-88. Did get up yesterday Plan is to go Home after hospital stay. no nausea and no vomiting Patient denies any chest pains or shortness of breath. Objective: Vital signs in last 24 hours: Temp:  [97.4 F (36.3 C)-98 F (36.7 C)] 98 F (36.7 C) (07/30 2057) Pulse Rate:  [41-64] 47 (07/30 2057) Resp:  [10-20] 18 (07/30 2057) BP: (97-141)/(46-61) 114/53 (07/30 2057) SpO2:  [90 %-98 %] 90 % (07/30 2057) Heels are non tender and elevated off the bed using rolled towels  Intake/Output from previous day: 07/30 0701 - 07/31 0700 In: 4121.7 [P.O.:290; I.V.:3331.7; IV Piggyback:500] Out: 1050 [Urine:600; Drains:400; Blood:50] Intake/Output this shift: No intake/output data recorded.   Recent Labs  11/13/16 0254  HGB 12.1*    Recent Labs  11/13/16 0254  WBC 7.6  RBC 3.97*  HCT 34.7*  PLT 187    Recent Labs  11/13/16 0254  NA 139  K 3.6  CL 106  CO2 26  BUN 19  CREATININE 1.09  GLUCOSE 107*  CALCIUM 8.7*   No results for input(s): LABPT, INR in the last 72 hours.  EXAM General - Patient is Alert, Appropriate and Oriented Extremity - Neurologically intact Neurovascular intact Sensation intact distally Intact pulses distally Dorsiflexion/Plantar flexion intact Compartment soft Dressing - dressing C/D/I Motor Function - intact, moving foot and toes well on exam.    Past Medical History:  Diagnosis Date  . Arthritis   . Diverticulosis    last attack 04/2011  . Elevated lipids   . Hypertension    dr  Clair Gulling   [place]    [place]  . Neuropathy   . Peripheral neuropathy   . Prostate enlargement     Assessment/Plan: 1 Day Post-Op Procedure(s) (LRB): COMPUTER ASSISTED TOTAL KNEE ARTHROPLASTY (Left) Active Problems:   S/P total  knee arthroplasty  Estimated body mass index is 28.7 kg/m as calculated from the following:   Height as of this encounter: 5\' 10"  (1.778 m).   Weight as of this encounter: 90.7 kg (200 lb). Advance diet Up with therapy D/C IV fluids Plan for discharge tomorrow Discharge home with home health  Labs: were reviewed  DVT Prophylaxis - Lovenox, Foot Pumps and TED hose Weight-Bearing as tolerated to left leg D/C O2 and Pulse OX and try on Room [employer] tomorrow am Begin working on a bowel movement  Felise Georgia R. [facility] 11/13/2016, 7:49 AM

## 2016-11-13 NOTE — Progress Notes (Signed)
Physical Therapy Treatment Patient Details Name: Mark Cowan MRN: [id number] DOB: April 06, 1934 Today's Date: 11/13/2016    History of Present Illness Pt. is an 83 y.o. male who was admitted to [facility] with a left TKR. Pt. PMHx includes: OA, Hyperlipidemia, and back surgery.    PT Comments    Participated in exercises as described below.  Pt able to stand with min assist and ambulate 72' with walker and min guard.  No LOB or buckling but gait decreased quality and some shakiness noted.  Returned to bed per his request upon return to room as he was up early with nursing to chair.  Wife in for session.   Follow Up Recommendations  Home health PT     Equipment Recommendations  Rolling walker with 5" wheels    Recommendations for Other Services       Precautions / Restrictions Precautions Precautions: Fall Restrictions Weight Bearing Restrictions: Yes LLE Weight Bearing: Weight bearing as tolerated    Mobility  Bed Mobility Overal bed mobility: Needs Assistance Bed Mobility: Sit to Supine     Supine to sit: Supervision     General bed mobility comments: Pt requires cues for sequencing but no external support. HOB elevated and bed rail utilized  Transfers Overall transfer level: Needs assistance Equipment used: Rolling walker (2 wheeled) Transfers: Sit to/from Stand Sit to Stand: Min guard;Min assist         General transfer comment: Pt provided cues for safe hand placement during transfer. Requires increased time. Once upright pt is steady with UE support on rolling walker  Ambulation/Gait Ambulation/Gait assistance: Min guard;Min assist Ambulation Distance (Feet): 60 Feet Assistive device: Rolling walker (2 wheeled) Gait Pattern/deviations: Step-to pattern;Step-through pattern Gait velocity: Decreased Gait velocity interpretation: <1.8 ft/sec, indicative of risk for recurrent falls General Gait Details: irregular pattern with verbal cues for walker  positioning   Stairs            Wheelchair Mobility    Modified Rankin (Stroke Patients Only)       Balance Overall balance assessment: Needs assistance Sitting-balance support: No upper extremity supported Sitting balance-Leahy Scale: Good     Standing balance support: Bilateral upper extremity supported Standing balance-Leahy Scale: Fair Standing balance comment: Support on rolling walker                            Cognition Arousal/Alertness: Awake/alert Behavior During Therapy: WFL for tasks assessed/performed Overall Cognitive Status: Within Functional Limits for tasks assessed                                        Exercises Total Joint Exercises Ankle Circles/Pumps: AROM;Both;10 reps;Supine Quad Sets: Strengthening;Both;10 reps;Supine Gluteal Sets: Strengthening;Both;10 reps;Supine Heel Slides: Strengthening;Left;10 reps;Supine Hip ABduction/ADduction: Strengthening;Left;10 reps;Supine Straight Leg Raises: Strengthening;Left;10 reps;Supine Long Arc Quad: Strengthening;Seated;Left;10 reps Knee Flexion: AAROM;Seated;Left;10 reps Goniometric ROM: 2-90 Marching in Standing: AROM;Left;Standing;10 reps Other Exercises Other Exercises: able to stand upon returning from gait to attempt to void with no results    General Comments        Pertinent Vitals/Pain Pain Assessment: 0-10 Pain Score: 3  Pain Location: L knee after gait Pain Descriptors / Indicators: Operative site guarding Pain Intervention(s): Limited activity within patient's tolerance;Premedicated before session;Repositioned;Ice applied    Home Living Family/patient expects to be discharged to:: Private residence Living Arrangements: Spouse/significant other Available Help  at Discharge: Family Type of Home: House Home Access: Stairs to enter Entrance Stairs-Rails: None Home Layout: One level Home Equipment: Grab bars - tub/shower;Cane - single point      Prior  Function Level of Independence: Independent      Comments: Independent with ADLs, and IADLs.   PT Goals (current goals can now be found in the care plan section) Acute Rehab PT Goals Patient Stated Goal: To regain independence Progress towards PT goals: Progressing toward goals    Frequency    BID      PT Plan Current plan remains appropriate    Co-evaluation              AM-PAC PT "6 Clicks" Daily Activity  Outcome Measure  Difficulty turning over in bed (including adjusting bedclothes, sheets and blankets)?: A Little Difficulty moving from lying on back to sitting on the side of the bed? : A Little Difficulty sitting down on and standing up from a chair with arms (e.g., wheelchair, bedside commode, etc,.)?: A Little Help needed moving to and from a bed to chair (including a wheelchair)?: A Little Help needed walking in hospital room?: A Little Help needed climbing 3-5 steps with a railing? : A Lot 6 Click Score: 17    End of Session Equipment Utilized During Treatment: Gait belt Activity Tolerance: Patient tolerated treatment well Patient left: in bed;with bed alarm set;with call bell/phone within reach;with family/visitor present   Pain - Right/Left: Left Pain - part of body: Knee     Time: 5009-3818 PT Time Calculation (min) (ACUTE ONLY): 22 min  Charges:  $Gait Training: 8-22 mins $Therapeutic Exercise: 8-22 mins                    G Codes:       Chesley Noon, PTA 11/13/16, 10:33 AM

## 2016-11-13 NOTE — Discharge Summary (Signed)
Physician Discharge Summary  Patient ID: Mark Cowan MRN: [id number] DOB/AGE: [birth year] 83 y.o.  Admit date: 11/12/2016 Discharge date: 11/14/2016  Admission Diagnoses:  primary osteoarthritis of left knee   Discharge Diagnoses: Patient Active Problem List   Diagnosis Date Noted  . S/P total knee arthroplasty 11/12/2016  . Diverticulitis of colon (without mention of hemorrhage)(562.11) 11/04/2012  . Screening for colon cancer 11/04/2012    Past Medical History:  Diagnosis Date  . Arthritis   . Diverticulosis    last attack 04/2011  . Elevated lipids   . Hypertension    dr  Clair Gulling   [place]    [place]  . Neuropathy   . Peripheral neuropathy   . Prostate enlargement      Transfusion:  No transfusion during this admission   Consultants (if any):   Discharged Condition: Improved  Hospital Course: Mark Cowan is an 83 y.o. male who was admitted 83 y.o. male who was admitted 11/12/2016 with a diagnosis of degenerative arthrosis of left knee and went to the operating room on 11/12/2016 and underwent the above named procedures.    Surgeries:Procedure(s): COMPUTER ASSISTED TOTAL KNEE ARTHROPLASTY on 11/12/2016  PRE-OPERATIVE DIAGNOSIS: Degenerative arthrosis of the left knee, primary  POST-OPERATIVE DIAGNOSIS:  Same  PROCEDURE:  Left total knee arthroplasty using computer-assisted navigation  SURGEON:  Marciano Sequin. M.D.  ASSISTANT:  Vance Peper, PA (present and scrubbed throughout the case, critical for assistance with exposure, retraction, instrumentation, and closure)  ANESTHESIA: spinal  ESTIMATED BLOOD LOSS: 50 mL  FLUIDS REPLACED: 1500 mL of crystalloid  TOURNIQUET TIME: 84 minutes  DRAINS: 2 medium Hemovac drains  SOFT TISSUE RELEASES: Anterior cruciate ligament, posterior cruciate ligament, deep medial collateral ligament, patellofemoral ligament  IMPLANTS UTILIZED: DePuy Attune size 7 posterior stabilized femoral component (cemented), size 7 rotating platform  tibial component (cemented), 38 mm medialized dome patella (cemented), and a 5 mm stabilized rotating platform polyethylene insert.  INDICATIONS FOR SURGERY: Mark Cowan is a 83 y.o. year old male with a long history of progressive knee pain. X-rays demonstrated severe degenerative changes in tricompartmental fashion. The patient had not seen any significant improvement despite conservative nonsurgical intervention. After discussion of the risks and benefits of surgical intervention, the patient expressed understanding of the risks benefits and agree with plans for total knee arthroplasty.   The risks, benefits, and alternatives were discussed at length including but not limited to the risks of infection, bleeding, nerve injury, stiffness, blood clots, the need for revision surgery, cardiopulmonary complications, among others, and they were willing to proceed.  Patient tolerated the surgery well. No complications .Patient was taken to PACU where she was stabilized and then transferred to the orthopedic floor.  Patient started on Lovenox 30 q 12 hrs. Foot pumps applied bilaterally at 80 mm hgb. Heels elevated off bed with rolled towels. No evidence of DVT. Calves non tender. Negative [place]. Physical therapy started on day #1 for gait training and transfer with OT starting on  day #1 for ADL and assisted devices. Patient has done well with therapy. Ambulated greater than 200 feet upon being discharged.  Patient's IV And Foley were discontinued on day #1 with Hemovac being discontinued on day #2. Dressing was changed on day 2 prior to patient being discharged   He was given perioperative antibiotics:  Anti-infectives    Start     Dose/Rate Route Frequency Ordered Stop   11/12/16 1200  ceFAZolin (ANCEF) IVPB 2g/100 mL premix     2 g 200 mL/hr over  30 Minutes Intravenous Every 6 hours 11/12/16 1145 11/13/16 0451   11/12/16 0603  ceFAZolin (ANCEF) 2-4 GM/100ML-% IVPB    Comments:  Ronnell Freshwater   : cabinet override      11/12/16 0603 11/12/16 0731   11/12/16 0600  ceFAZolin (ANCEF) IVPB 2g/100 mL premix     2 g 200 mL/hr over 30 Minutes Intravenous On call to O.R. 11/11/16 2230 11/12/16 0801    .  He was fitted with AV 1 compression foot pump devices, instructed on heel pumps, early ambulation, and fitted with TED stockings bilaterally for DVT prophylaxis.  He benefited maximally from the hospital stay and there were no complications.    Recent vital signs:  Vitals:   11/12/16 1604 11/12/16 2057  BP: (!) 133/46 (!) 114/53  Pulse: (!) 47 (!) 47  Resp:  18  Temp:  98 F (36.7 C)    Recent laboratory studies:  Lab Results  Component Value Date   HGB 12.1 (L) 11/13/2016   HGB 14.2 10/31/2016   HGB 14.5 07/23/2011   Lab Results  Component Value Date   WBC 7.6 11/13/2016   PLT 187 11/13/2016   Lab Results  Component Value Date   INR 1.12 10/31/2016   Lab Results  Component Value Date   NA 139 11/13/2016   K 3.6 11/13/2016   CL 106 11/13/2016   CO2 26 11/13/2016   BUN 19 11/13/2016   CREATININE 1.09 11/13/2016   GLUCOSE 107 (H) 11/13/2016    Discharge Medications:   Allergies as of 11/14/2016      Reactions   Statins Other (See Comments)   Muscle pain       Medication List    STOP taking these medications   aspirin EC 81 MG tablet     TAKE these medications   amLODipine 2.5 MG tablet Commonly known as:  NORVASC Take 2.5 mg by mouth daily.   Co Q 10 100 MG Caps Take 100 mg by mouth daily.   docusate sodium 100 MG capsule Commonly known as:  COLACE Take 100 mg by mouth every evening.   dutasteride 0.5 MG capsule Commonly known as:  AVODART Take 0.5 mg by mouth daily.   enoxaparin 30 MG/0.3ML injection Commonly known as:  LOVENOX Inject 0.4 mLs (40 mg total) into the skin daily.   fenofibrate 160 MG tablet Take 160 mg by mouth every evening.   fish oil-omega-3 fatty acids 1000 MG capsule Take 1 g by mouth 2 (two) times  daily.   gabapentin 300 MG capsule Commonly known as:  NEURONTIN Take 300 mg by mouth every evening.   GLUCOSAMINE 1500 COMPLEX PO Take 1,500 mg by mouth 2 (two) times daily.   losartan-hydrochlorothiazide 100-25 MG tablet Commonly known as:  HYZAAR Take 1 tablet by mouth daily.   Lysine 500 MG Caps Take 500 mg by mouth daily.   oxyCODONE 5 MG immediate release tablet Commonly known as:  Oxy IR/ROXICODONE Take 1-2 tablets (5-10 mg total) by mouth every 4 (four) hours as needed for severe pain or breakthrough pain.   rosuvastatin 10 MG tablet Commonly known as:  CRESTOR Take 10 mg by mouth 2 (two) times a week. Tuesday and Friday   tamsulosin 0.4 MG Caps capsule Commonly known as:  FLOMAX Take 0.4 mg by mouth every evening.   traMADol 50 MG tablet Commonly known as:  ULTRAM Take 1-2 tablets (50-100 mg total) by mouth every 4 (four) hours as needed for moderate pain.  Vitamin B-12 2500 MCG Subl Take 2,500 mcg by mouth daily.   vitamin C 500 MG tablet Commonly known as:  ASCORBIC ACID Take 500 mg by mouth daily.   Vitamin D3 2000 units Tabs Take 2,000 Units by mouth daily.            Durable Medical Equipment        Start     Ordered   11/12/16 1145  DME Walker rolling  Once    Question:  Patient needs a walker to treat with the following condition  Answer:  Total knee replacement status   11/12/16 1145   11/12/16 1145  DME Bedside commode  Once    Question:  Patient needs a bedside commode to treat with the following condition  Answer:  Total knee replacement status   11/12/16 1145      Diagnostic Studies: Dg Knee Left Port  Result Date: 11/12/2016 CLINICAL DATA:  Total left knee replacement. EXAM: PORTABLE LEFT KNEE - 1-2 VIEW COMPARISON:  No recent prior . FINDINGS: Total left knee replacement anatomic alignment. Hardware intact. No acute bony abnormality. IMPRESSION: Total left knee replacement anatomic alignment. Electronically Signed   By: Marcello Moores   Register   On: 11/12/2016 11:09    Disposition: 01-Home or Self Care    Follow-up Information    Watt Climes, PA On 11/27/2016.   Specialty:  Physician Assistant Why:  at 1:15pm Contact information: [place] [address] [phone]        Dereck Leep, MD On 12/25/2016.   Specialty:  Orthopedic Surgery Why:  at 9:15am Contact information: [place] [address] [phone]            Signed: Watt Climes 11/13/2016, 7:56 AM

## 2016-11-13 NOTE — Anesthesia Postprocedure Evaluation (Signed)
Anesthesia Post Note  Patient: Mark Cowan  Procedure(s) Performed: Procedure(s) (LRB): COMPUTER ASSISTED TOTAL KNEE ARTHROPLASTY (Left)  Patient location during evaluation: Nursing Unit Anesthesia Type: Spinal Level of consciousness: awake, awake and alert and oriented Pain management: pain level controlled Vital Signs Assessment: post-procedure vital signs reviewed and stable Respiratory status: spontaneous breathing, nonlabored ventilation and respiratory function stable Cardiovascular status: stable Anesthetic complications: no     Last Vitals:  Vitals:   11/12/16 1604 11/12/16 2057  BP: (!) 133/46 (!) 114/53  Pulse: (!) 47 (!) 47  Resp:  18  Temp:  36.7 C    Last Pain:  Vitals:   11/13/16 0417  TempSrc:   PainSc: Asleep                 Lance Muss

## 2016-11-13 NOTE — Discharge Instructions (Signed)

## 2016-11-13 NOTE — Evaluation (Signed)
Occupational Therapy Evaluation Patient Details Name: Mark Cowan MRN: [id number] DOB: 1934-02-27 Today's Date: 11/13/2016    History of Present Illness Pt. is an 83 y.o. male who was admitted to [facility] with a left TKR. Pt. PMHx includes: OA, Hyperlipidemia, and back surgery.   Clinical Impression   Pt is an 83 y.o. male who was admitted for elective and elective left TKR. Pt. resides at home with his wife, and was independent with ADLs. Pt. wife reports pt. had difficulty donning socks prior to admission. Pt. and wife were provided with verbal education about A/E use for LE ADLs, and where sockaides can be found for purchase. Pt. required min guard for standing ADL tasks. Pt. Requires assist for multiple lines, and gown. Pt. Presents with weakness, 3/10 pain, and limited functional mobility. Pt. could benefit from skilled OT services for ADL training, A/E training, and pt. Education about home modification, and DME. Pt. Plans to return home upon discharge. Pt. Wife will assist pt. with ADL, and meals as needed.    Follow Up Recommendations    No follow-up OT services   Equipment Recommendations       Recommendations for Other Services       Precautions / Restrictions Precautions Precautions: Fall Restrictions Weight Bearing Restrictions: Yes LLE Weight Bearing: Weight bearing as tolerated             ADL either performed or assessed with clinical judgement   ADL Overall ADL's : Needs assistance/impaired     Grooming: Independent;Set up;Sitting   Upper Body Bathing: Set up   Lower Body Bathing: Maximal assistance   Upper Body Dressing : Set up   Lower Body Dressing: Maximal assistance     Toilet Transfer: Min guard standing for attempt to use urinal, however pt. did not have to urinate.         Functional mobility during ADLs: Min guard General ADL Comments: Pt. education was provided about A/E use for LE ADLs.     Vision  No change from baseline        Perception     Praxis      Pertinent Vitals/Pain  3/10. Pt. Pain was monitored during session, pt. Was repositioned as needed.     Hand Dominance Right   Extremity/Trunk Assessment Upper Extremity Assessment Upper Extremity Assessment: Overall WFL for tasks assessed           Communication Communication Communication: No difficulties   Cognition                                           General Comments       Exercises     Shoulder Instructions      Home Living Family/patient expects to be discharged to:: Private residence Living Arrangements: Spouse/significant other Available Help at Discharge: Family Type of Home: House Home Access: Stairs to enter Technical brewer of Steps: 3 Entrance Stairs-Rails: None Home Layout: One level     Bathroom Shower/Tub: Teacher, early years/pre: Handicapped height     Home Equipment: Grab bars - tub/shower;Cane - single point          Prior Functioning/Environment Level of Independence: Independent        Comments: Independent with ADLs, and IADLs.        OT Problem List: Decreased strength;Decreased activity tolerance;Impaired UE functional use;Decreased knowledge of use of  DME or AE;Decreased cognition;Pain      OT Treatment/Interventions: Self-care/ADL training;Therapeutic exercise;Patient/family education;Therapeutic activities;Energy conservation;DME and/or AE instruction    OT Goals(Current goals can be found in the care plan section) Acute Rehab OT Goals Patient Stated Goal: To regain independence OT Goal Formulation: With patient Potential to Achieve Goals: Good  OT Frequency: Min 2X/week   Barriers to D/C:            Co-evaluation              AM-PAC PT "6 Clicks" Daily Activity     Outcome Measure Help from another person eating meals?: None Help from another person taking care of personal grooming?: None Help from another person toileting, which  includes using toliet, bedpan, or urinal?: A Little Help from another person bathing (including washing, rinsing, drying)?: A Lot Help from another person to put on and taking off regular upper body clothing?: A Little Help from another person to put on and taking off regular lower body clothing?: A Lot 6 Click Score: 18   End of Session    Activity Tolerance: Patient tolerated treatment well Patient left:    OT Visit Diagnosis: Muscle weakness (generalized) (M62.81)                Time: 6286-3817 OT Time Calculation (min): 15 min Charges:  OT General Charges $OT Visit: 1 Procedure OT Evaluation $OT Eval Moderate Complexity: 1 Procedure G-Codes:    Harrel Carina, MS, OTR/L Harrel Carina, MS, OTR/L 11/13/2016, 9:59 AM

## 2016-11-13 NOTE — Progress Notes (Signed)
Clinical Social Worker (CSW) received SNF consult. PT is recommending home health. RN case manager aware of above. Please reconsult if future social work needs arise. CSW signing off.   Edwar Coe, LCSW [phone] 

## 2016-11-14 LAB — CBC
HCT: 34.7 % — ABNORMAL LOW (ref 40.0–52.0)
HEMOGLOBIN: 12 g/dL — AB (ref 13.0–18.0)
MCH: 29.6 pg (ref 26.0–34.0)
MCHC: 34.5 g/dL (ref 32.0–36.0)
MCV: 85.9 fL (ref 80.0–100.0)
PLATELETS: 220 10*3/uL (ref 150–440)
RBC: 4.04 MIL/uL — AB (ref 4.40–5.90)
RDW: 13.2 % (ref 11.5–14.5)
WBC: 8.3 10*3/uL (ref 3.8–10.6)

## 2016-11-14 LAB — BASIC METABOLIC PANEL
Anion gap: 8 (ref 5–15)
BUN: 16 mg/dL (ref 6–20)
CHLORIDE: 101 mmol/L (ref 101–111)
CO2: 26 mmol/L (ref 22–32)
Calcium: 9 mg/dL (ref 8.9–10.3)
Creatinine, Ser: 0.88 mg/dL (ref 0.61–1.24)
GFR calc Af Amer: >60 mL/min (ref >60)
Glucose, Bld: 132 mg/dL — ABNORMAL HIGH (ref 65–99)
POTASSIUM: 3.8 mmol/L (ref 3.5–5.1)
SODIUM: 135 mmol/L (ref 135–145)

## 2016-11-14 MED ORDER — ENOXAPARIN SODIUM 30 MG/0.3ML ~~LOC~~ SOLN: 40.0000 mg | SUBCUTANEOUS | 0 refills | Status: DC

## 2016-11-14 MED ORDER — LACTULOSE 10 GM/15ML PO SOLN
10.0000 g | Freq: Two times a day (BID) | ORAL | Status: DC | PRN
  Filled 2016-11-14: qty 30

## 2016-11-14 MED ORDER — TRAMADOL HCL 50 MG PO TABS: 50.0000 mg | ORAL_TABLET | ORAL | 0 refills | Status: DC | PRN

## 2016-11-14 MED ORDER — OXYCODONE HCL 5 MG PO TABS: 5.0000 mg | ORAL_TABLET | ORAL | 0 refills | Status: DC | PRN

## 2016-11-14 NOTE — Care Management Note (Signed)
Case Management Note  Patient Details  Name: [name] MRN: [id number] Date of Birth: 1934/01/24  Subjective/Objective: Discharging today                  Action/Plan: Cost of Lovenox is $ 85.00. Patient and is wife updated and denies issues paying for medication. [facility] notified of discharge. DME delivered.   Expected Discharge Date:  11/14/16               Expected Discharge Plan:  [place]  In-House Referral:     Discharge planning Services  CM Consult  Post Acute Care Choice:  Durable Medical Equipment, Home Health Choice offered to:  Patient  DME Arranged:  [place] rolling DME Agency:  [place]:    [place]:  [facility] ([facility])  Status of Service:  Completed, signed off  If discussed at [employer], dates discussed:    Additional Comments:  Jolly Mango, RN 11/14/2016, 9:25 AM

## 2016-11-14 NOTE — Care Management Important Message (Signed)
Important Message  Patient Details  Name: Mark Cowan MRN: [id number] Date of Birth: [birth year]   Medicare Important Message Given:  N/A - LOS <3 / Initial given by admissions    Jolly Mango, RN 11/14/2016, 8:06 AM

## 2016-11-14 NOTE — Progress Notes (Addendum)
   Subjective: 2 Days Post-Op Procedure(s) (LRB): COMPUTER ASSISTED TOTAL KNEE ARTHROPLASTY (Left) Patient reports pain as moderate.   Patient is well, and has had no acute complaints or problems Pt did well with therapy yesterday Plan is to go Home after hospital stay. no nausea and no vomiting Patient denies any chest pains or shortness of breath. Objective: Vital signs in last 24 hours: Temp:  [97.7 F (36.5 C)-98.8 F (37.1 C)] 98.1 F (36.7 C) (08/01 0317) Pulse Rate:  [55-79] 75 (08/01 0317) Resp:  [18-19] 19 (08/01 0317) BP: (140-184)/(56-65) 147/64 (08/01 0317) SpO2:  [90 %] 90 % (07/31 2330) well approximated incision Heels are non tender and elevated off the bed using rolled towels Intake/Output from previous day: 07/31 0701 - 08/01 0700 In: 1828.3 [P.O.:360; I.V.:1368.3; IV Piggyback:100] Out: 370 [Drains:370] Intake/Output this shift: Total I/O In: 0  Out: 120 [Drains:120]   Recent Labs  11/13/16 0254 11/14/16 0324  HGB 12.1* 12.0*    Recent Labs  11/13/16 0254 11/14/16 0324  WBC 7.6 8.3  RBC 3.97* 4.04*  HCT 34.7* 34.7*  PLT 187 220    Recent Labs  11/13/16 0254 11/14/16 0324  NA 139 135  K 3.6 3.8  CL 106 101  CO2 26 26  BUN 19 16  CREATININE 1.09 0.88  GLUCOSE 107* 132*  CALCIUM 8.7* 9.0   No results for input(s): LABPT, INR in the last 72 hours.  EXAM General - Patient is Alert, Appropriate and Oriented Extremity - Neurologically intact Neurovascular intact Sensation intact distally Intact pulses distally Dorsiflexion/Plantar flexion intact No cellulitis present Compartment soft Dressing - scant drainage Motor Function - intact, moving foot and toes well on exam.    Past Medical History:  Diagnosis Date  . Arthritis   . Diverticulosis    last attack 04/2011  . Elevated lipids   . Hypertension    dr  Clair Gulling   [place]    [place]  . Neuropathy   . Peripheral neuropathy   . Prostate enlargement     Assessment/Plan: 2  Days Post-Op Procedure(s) (LRB): COMPUTER ASSISTED TOTAL KNEE ARTHROPLASTY (Left) Active Problems:   S/P total knee arthroplasty  Estimated body mass index is 28.7 kg/m as calculated from the following:   Height as of this encounter: 5\' 10"  (1.778 m).   Weight as of this encounter: 90.7 kg (200 lb). Up with therapy Discharge home with home health  Labs: reviewed DVT Prophylaxis - Lovenox, Foot Pumps and TED hose Weight-Bearing as tolerated to right leg Pt needs a bowel movement today Please wash the operative leg and apply TED stocking to both legs. Bone foam to go home with pt hemovac d/c'd today Please change dressing prior to pt being d/c'd and give the pt 2 extra honeycomb dressing to take home   [place]. [place] 11/14/2016, 6:58 AM

## 2016-11-14 NOTE — Progress Notes (Signed)
Physical Therapy Treatment Patient Details Name: Mark Cowan MRN: [id number] DOB: June 17, 1933 Today's Date: 11/14/2016    History of Present Illness Pt. is an 83 y.o. male who was admitted to [facility] with a left TKR. Pt. PMHx includes: OA, Hyperlipidemia, and back surgery.    PT Comments    Pt does very well with therapist on this date. He is able to complete a full lap around RN station and complete stair training. Pt is steady and safe while ascending/descending 4 steps with bilateral hand rails and step-to pattern. Pt is able to complete seated and standing exercises with therapist. Pt is safe to discharge home with wife and [facility] PT when medically appropriate. Pt will benefit from PT services to address deficits in strength, balance, and mobility in order to return to full function at home.     Follow Up Recommendations  Home health PT     Equipment Recommendations  Rolling walker with 5" wheels    Recommendations for Other Services       Precautions / Restrictions Precautions Precautions: Fall Restrictions Weight Bearing Restrictions: Yes LLE Weight Bearing: Weight bearing as tolerated    Mobility  Bed Mobility Overal bed mobility: Modified Independent Bed Mobility: Supine to Sit     Supine to sit: Modified independent (Device/Increase time)     General bed mobility comments: Good speed and sequencing. HOB elevated and bed rails utilized. No external assist required  Transfers Overall transfer level: Needs assistance Equipment used: Rolling walker (2 wheeled) Transfers: Sit to/from Stand Sit to Stand: Min guard         General transfer comment: Pt demonstrates safe hand placement and good sequencing with transfers. Pt is steady in standing with UE support and in wide stance  Ambulation/Gait Ambulation/Gait assistance: Min guard Ambulation Distance (Feet): 250 Feet Assistive device: Rolling walker (2 wheeled) Gait Pattern/deviations: Step-through pattern Gait  velocity: WFL Gait velocity interpretation: at or above normal speed for age/gender General Gait Details: Pt able to complete a full lap around RN station with therapist. He demonstrates excellent speed and step-through pattern during ambualtion. Step length appears roughly symmetrical and pt appears to put very little weight through walker with bilateral UEs. VSS throughout ambulation however SaO2 is in the lower ranges between 91-93%. Pt reports very minimal DOE with ambulation but no signs of respiratory distress   Stairs Stairs: Yes   Stair Management: Step to pattern;Two rails Number of Stairs: 4 General stair comments: Pt able to ascend/descend 4 steps with step-to pattern. Education about proper sequencing and bilateral hand rails utilized. Pt able to complete stairs confidently and safely. No signs for instability with stair training.  Wheelchair Mobility    Modified Rankin (Stroke Patients Only)       Balance Overall balance assessment: Needs assistance Sitting-balance support: No upper extremity supported Sitting balance-Leahy Scale: Good     Standing balance support: No upper extremity supported Standing balance-Leahy Scale: Fair Standing balance comment: Pt able to stand today without UE support on walker                            Cognition Arousal/Alertness: Awake/alert Behavior During Therapy: WFL for tasks assessed/performed Overall Cognitive Status: Within Functional Limits for tasks assessed  Exercises Total Joint Exercises Ankle Circles/Pumps: Strengthening;Both;15 reps;Seated;Other (comment) (Heel raises) Hip ABduction/ADduction: Strengthening;15 reps;Seated;Both Long Arc Quad: Strengthening;Left;15 reps;Seated Knee Flexion: Strengthening;Left;15 reps;Seated Goniometric ROM: 0-94 AAROM, pain limited for flexion Marching in Standing: Strengthening;Both;15 reps;Seated Other Exercises Other  Exercises: Standing marches x 10, standing mini squats x 10, standing heel raises x 10    General Comments        Pertinent Vitals/Pain Pain Assessment: 0-10 Pain Score: 2  Pain Location: L knee Pain Descriptors / Indicators: Aching Pain Intervention(s): Monitored during session    Home Living                      Prior Function            PT Goals (current goals can now be found in the care plan section) Acute Rehab PT Goals Patient Stated Goal: To regain independence PT Goal Formulation: With patient/family Time For Goal Achievement: 11/26/16 Potential to Achieve Goals: Good Progress towards PT goals: Progressing toward goals    Frequency    BID      PT Plan Current plan remains appropriate    Co-evaluation              AM-PAC PT "6 Clicks" Daily Activity  Outcome Measure  Difficulty turning over in bed (including adjusting bedclothes, sheets and blankets)?: A Little Difficulty moving from lying on back to sitting on the side of the bed? : A Little Difficulty sitting down on and standing up from a chair with arms (e.g., wheelchair, bedside commode, etc,.)?: A Little Help needed moving to and from a bed to chair (including a wheelchair)?: A Little Help needed walking in hospital room?: A Little Help needed climbing 3-5 steps with a railing? : A Little 6 Click Score: 18    End of Session Equipment Utilized During Treatment: Gait belt Activity Tolerance: Patient tolerated treatment well Patient left: with call bell/phone within reach;in chair;with chair alarm set;with SCD's reapplied;Other (comment) (towel roll under heel, polar care in place)   PT Visit Diagnosis: Unsteadiness on feet (R26.81);Muscle weakness (generalized) (M62.81);Pain Pain - Right/Left: Left Pain - part of body: Knee     Time: 0842-0909 PT Time Calculation (min) (ACUTE ONLY): 27 min  Charges:  $Gait Training: 8-22 mins $Therapeutic Exercise: 8-22 mins                     G Codes:       Lyndel Safe Huprich PT, DPT    Huprich,Jason 11/14/2016, 10:33 AM

## 2016-11-14 NOTE — Progress Notes (Signed)
MOM and warm prune juice administered, to assist with BM, in hopes of patient being D/C home today.

## 2016-11-14 NOTE — Progress Notes (Signed)
Mark Cowan to be D/C'd Home per MD order.  Discussed with the patient and all questions fully answered.  VSS, Skin clean, dry and intact without evidence of skin break down, no evidence of skin tears noted. IV catheter discontinued intact. Site without signs and symptoms of complications. Dressing and pressure applied.  An After Visit Summary was printed and given to the patient. Patient received prescription.  D/c education completed with patient/family including follow up instructions, medication list, d/c activities limitations if indicated, with other d/c instructions as indicated by MD - patient able to verbalize understanding, all questions fully answered.   Patient instructed to return to ED, call 911, or call MD for any changes in condition.   Patient escorted via [place], and D/C home via private auto.  Mark Cowan 11/14/2016 12:34 PM

## 2016-11-14 NOTE — Progress Notes (Signed)
Patient alert and oriented. Denies pain. Honeycomb dressing with minimal drainage, polar care intact. Patient doing well with ambulating. Wife educated to administer Lovenox shot. Patient and wife feels comfortable with that. Awaiting a bowel movement before discharge.   Deri Fuelling, RN

## 2016-11-16 DIAGNOSIS — K5790 Diverticulosis of intestine, part unspecified, without perforation or abscess without bleeding: Secondary | ICD-10-CM | POA: Diagnosis not present

## 2016-11-16 DIAGNOSIS — E785 Hyperlipidemia, unspecified: Secondary | ICD-10-CM | POA: Diagnosis not present

## 2016-11-16 DIAGNOSIS — G629 Polyneuropathy, unspecified: Secondary | ICD-10-CM | POA: Diagnosis not present

## 2016-11-16 DIAGNOSIS — Z79891 Long term (current) use of opiate analgesic: Secondary | ICD-10-CM | POA: Diagnosis not present

## 2016-11-16 DIAGNOSIS — Z96652 Presence of left artificial knee joint: Secondary | ICD-10-CM | POA: Diagnosis not present

## 2016-11-16 DIAGNOSIS — N400 Enlarged prostate without lower urinary tract symptoms: Secondary | ICD-10-CM | POA: Diagnosis not present

## 2016-11-16 DIAGNOSIS — I10 Essential (primary) hypertension: Secondary | ICD-10-CM | POA: Diagnosis not present

## 2016-11-16 DIAGNOSIS — Z471 Aftercare following joint replacement surgery: Secondary | ICD-10-CM | POA: Diagnosis not present

## 2016-11-16 DIAGNOSIS — Z9181 History of falling: Secondary | ICD-10-CM | POA: Diagnosis not present

## 2016-11-19 DIAGNOSIS — K5790 Diverticulosis of intestine, part unspecified, without perforation or abscess without bleeding: Secondary | ICD-10-CM | POA: Diagnosis not present

## 2016-11-19 DIAGNOSIS — N400 Enlarged prostate without lower urinary tract symptoms: Secondary | ICD-10-CM | POA: Diagnosis not present

## 2016-11-19 DIAGNOSIS — Z471 Aftercare following joint replacement surgery: Secondary | ICD-10-CM | POA: Diagnosis not present

## 2016-11-19 DIAGNOSIS — Z96652 Presence of left artificial knee joint: Secondary | ICD-10-CM | POA: Diagnosis not present

## 2016-11-19 DIAGNOSIS — E785 Hyperlipidemia, unspecified: Secondary | ICD-10-CM | POA: Diagnosis not present

## 2016-11-19 DIAGNOSIS — I10 Essential (primary) hypertension: Secondary | ICD-10-CM | POA: Diagnosis not present

## 2016-11-19 DIAGNOSIS — Z9181 History of falling: Secondary | ICD-10-CM | POA: Diagnosis not present

## 2016-11-19 DIAGNOSIS — G629 Polyneuropathy, unspecified: Secondary | ICD-10-CM | POA: Diagnosis not present

## 2016-11-19 DIAGNOSIS — Z79891 Long term (current) use of opiate analgesic: Secondary | ICD-10-CM | POA: Diagnosis not present

## 2016-11-26 DIAGNOSIS — G629 Polyneuropathy, unspecified: Secondary | ICD-10-CM | POA: Diagnosis not present

## 2016-11-26 DIAGNOSIS — Z9181 History of falling: Secondary | ICD-10-CM | POA: Diagnosis not present

## 2016-11-26 DIAGNOSIS — K5790 Diverticulosis of intestine, part unspecified, without perforation or abscess without bleeding: Secondary | ICD-10-CM | POA: Diagnosis not present

## 2016-11-26 DIAGNOSIS — Z96652 Presence of left artificial knee joint: Secondary | ICD-10-CM | POA: Diagnosis not present

## 2016-11-26 DIAGNOSIS — N400 Enlarged prostate without lower urinary tract symptoms: Secondary | ICD-10-CM | POA: Diagnosis not present

## 2016-11-26 DIAGNOSIS — Z471 Aftercare following joint replacement surgery: Secondary | ICD-10-CM | POA: Diagnosis not present

## 2016-11-26 DIAGNOSIS — Z79891 Long term (current) use of opiate analgesic: Secondary | ICD-10-CM | POA: Diagnosis not present

## 2016-11-26 DIAGNOSIS — E785 Hyperlipidemia, unspecified: Secondary | ICD-10-CM | POA: Diagnosis not present

## 2016-11-26 DIAGNOSIS — I10 Essential (primary) hypertension: Secondary | ICD-10-CM | POA: Diagnosis not present

## 2016-11-27 DIAGNOSIS — M6281 Muscle weakness (generalized): Secondary | ICD-10-CM | POA: Diagnosis not present

## 2016-11-27 DIAGNOSIS — M25562 Pain in left knee: Secondary | ICD-10-CM | POA: Diagnosis not present

## 2016-11-27 DIAGNOSIS — Z96652 Presence of left artificial knee joint: Secondary | ICD-10-CM | POA: Diagnosis not present

## 2016-11-27 DIAGNOSIS — M25662 Stiffness of left knee, not elsewhere classified: Secondary | ICD-10-CM | POA: Diagnosis not present

## 2016-11-29 DIAGNOSIS — Z96652 Presence of left artificial knee joint: Secondary | ICD-10-CM | POA: Diagnosis not present

## 2016-11-29 DIAGNOSIS — M25562 Pain in left knee: Secondary | ICD-10-CM | POA: Diagnosis not present

## 2016-12-03 DIAGNOSIS — Z96652 Presence of left artificial knee joint: Secondary | ICD-10-CM | POA: Diagnosis not present

## 2016-12-03 DIAGNOSIS — M25562 Pain in left knee: Secondary | ICD-10-CM | POA: Diagnosis not present

## 2016-12-05 DIAGNOSIS — Z96652 Presence of left artificial knee joint: Secondary | ICD-10-CM | POA: Diagnosis not present

## 2016-12-05 DIAGNOSIS — M25562 Pain in left knee: Secondary | ICD-10-CM | POA: Diagnosis not present

## 2016-12-07 DIAGNOSIS — M25562 Pain in left knee: Secondary | ICD-10-CM | POA: Diagnosis not present

## 2016-12-07 DIAGNOSIS — Z96652 Presence of left artificial knee joint: Secondary | ICD-10-CM | POA: Diagnosis not present

## 2016-12-10 DIAGNOSIS — M25562 Pain in left knee: Secondary | ICD-10-CM | POA: Diagnosis not present

## 2016-12-10 DIAGNOSIS — Z96652 Presence of left artificial knee joint: Secondary | ICD-10-CM | POA: Diagnosis not present

## 2016-12-10 DIAGNOSIS — Z471 Aftercare following joint replacement surgery: Secondary | ICD-10-CM | POA: Diagnosis not present

## 2016-12-12 DIAGNOSIS — Z96652 Presence of left artificial knee joint: Secondary | ICD-10-CM | POA: Diagnosis not present

## 2016-12-12 DIAGNOSIS — M25562 Pain in left knee: Secondary | ICD-10-CM | POA: Diagnosis not present

## 2016-12-14 DIAGNOSIS — Z96652 Presence of left artificial knee joint: Secondary | ICD-10-CM | POA: Diagnosis not present

## 2016-12-18 DIAGNOSIS — Z96652 Presence of left artificial knee joint: Secondary | ICD-10-CM | POA: Diagnosis not present

## 2016-12-18 DIAGNOSIS — M25562 Pain in left knee: Secondary | ICD-10-CM | POA: Diagnosis not present

## 2016-12-20 DIAGNOSIS — Z96652 Presence of left artificial knee joint: Secondary | ICD-10-CM | POA: Diagnosis not present

## 2016-12-20 DIAGNOSIS — M25562 Pain in left knee: Secondary | ICD-10-CM | POA: Diagnosis not present

## 2016-12-25 DIAGNOSIS — Z96652 Presence of left artificial knee joint: Secondary | ICD-10-CM | POA: Diagnosis not present

## 2016-12-25 DIAGNOSIS — M25562 Pain in left knee: Secondary | ICD-10-CM | POA: Diagnosis not present

## 2016-12-27 DIAGNOSIS — M25562 Pain in left knee: Secondary | ICD-10-CM | POA: Diagnosis not present

## 2016-12-27 DIAGNOSIS — Z96652 Presence of left artificial knee joint: Secondary | ICD-10-CM | POA: Diagnosis not present

## 2016-12-31 DIAGNOSIS — N401 Enlarged prostate with lower urinary tract symptoms: Secondary | ICD-10-CM | POA: Diagnosis not present

## 2016-12-31 DIAGNOSIS — R351 Nocturia: Secondary | ICD-10-CM | POA: Diagnosis not present

## 2017-01-01 DIAGNOSIS — Z96652 Presence of left artificial knee joint: Secondary | ICD-10-CM | POA: Diagnosis not present

## 2017-01-01 DIAGNOSIS — M25562 Pain in left knee: Secondary | ICD-10-CM | POA: Diagnosis not present

## 2017-01-03 DIAGNOSIS — Z96652 Presence of left artificial knee joint: Secondary | ICD-10-CM | POA: Diagnosis not present

## 2017-01-08 DIAGNOSIS — Z96652 Presence of left artificial knee joint: Secondary | ICD-10-CM | POA: Diagnosis not present

## 2017-01-10 DIAGNOSIS — M6281 Muscle weakness (generalized): Secondary | ICD-10-CM | POA: Diagnosis not present

## 2017-01-10 DIAGNOSIS — Z96652 Presence of left artificial knee joint: Secondary | ICD-10-CM | POA: Diagnosis not present

## 2017-01-15 DIAGNOSIS — Z96652 Presence of left artificial knee joint: Secondary | ICD-10-CM | POA: Diagnosis not present

## 2017-01-15 DIAGNOSIS — M6281 Muscle weakness (generalized): Secondary | ICD-10-CM | POA: Diagnosis not present

## 2017-01-17 DIAGNOSIS — Z96652 Presence of left artificial knee joint: Secondary | ICD-10-CM | POA: Diagnosis not present

## 2017-01-17 DIAGNOSIS — M6281 Muscle weakness (generalized): Secondary | ICD-10-CM | POA: Diagnosis not present

## 2017-01-22 DIAGNOSIS — Z96652 Presence of left artificial knee joint: Secondary | ICD-10-CM | POA: Diagnosis not present

## 2017-01-29 DIAGNOSIS — Z125 Encounter for screening for malignant neoplasm of prostate: Secondary | ICD-10-CM | POA: Diagnosis not present

## 2017-01-29 DIAGNOSIS — Z0000 Encounter for general adult medical examination without abnormal findings: Secondary | ICD-10-CM | POA: Diagnosis not present

## 2017-01-30 DIAGNOSIS — Z96652 Presence of left artificial knee joint: Secondary | ICD-10-CM | POA: Diagnosis not present

## 2017-02-05 DIAGNOSIS — E119 Type 2 diabetes mellitus without complications: Secondary | ICD-10-CM | POA: Diagnosis not present

## 2017-02-05 DIAGNOSIS — Z0000 Encounter for general adult medical examination without abnormal findings: Secondary | ICD-10-CM | POA: Diagnosis not present

## 2017-02-05 DIAGNOSIS — D649 Anemia, unspecified: Secondary | ICD-10-CM | POA: Diagnosis not present

## 2017-02-05 DIAGNOSIS — E785 Hyperlipidemia, unspecified: Secondary | ICD-10-CM | POA: Diagnosis not present

## 2017-03-20 DIAGNOSIS — H2513 Age-related nuclear cataract, bilateral: Secondary | ICD-10-CM | POA: Diagnosis not present

## 2017-05-23 DIAGNOSIS — D225 Melanocytic nevi of trunk: Secondary | ICD-10-CM | POA: Diagnosis not present

## 2017-05-23 DIAGNOSIS — L918 Other hypertrophic disorders of the skin: Secondary | ICD-10-CM | POA: Diagnosis not present

## 2017-05-23 DIAGNOSIS — L821 Other seborrheic keratosis: Secondary | ICD-10-CM | POA: Diagnosis not present

## 2017-05-23 DIAGNOSIS — L853 Xerosis cutis: Secondary | ICD-10-CM | POA: Diagnosis not present

## 2017-05-23 DIAGNOSIS — Z85820 Personal history of malignant melanoma of skin: Secondary | ICD-10-CM | POA: Diagnosis not present

## 2017-05-23 DIAGNOSIS — L814 Other melanin hyperpigmentation: Secondary | ICD-10-CM | POA: Diagnosis not present

## 2017-05-23 DIAGNOSIS — D1801 Hemangioma of skin and subcutaneous tissue: Secondary | ICD-10-CM | POA: Diagnosis not present

## 2017-05-23 DIAGNOSIS — D2262 Melanocytic nevi of left upper limb, including shoulder: Secondary | ICD-10-CM | POA: Diagnosis not present

## 2017-05-23 DIAGNOSIS — D2271 Melanocytic nevi of right lower limb, including hip: Secondary | ICD-10-CM | POA: Diagnosis not present

## 2017-05-23 DIAGNOSIS — Z85828 Personal history of other malignant neoplasm of skin: Secondary | ICD-10-CM | POA: Diagnosis not present

## 2017-05-23 DIAGNOSIS — B351 Tinea unguium: Secondary | ICD-10-CM | POA: Diagnosis not present

## 2017-05-23 DIAGNOSIS — L570 Actinic keratosis: Secondary | ICD-10-CM | POA: Diagnosis not present

## 2017-06-03 DIAGNOSIS — J22 Unspecified acute lower respiratory infection: Secondary | ICD-10-CM | POA: Diagnosis not present

## 2017-11-07 DIAGNOSIS — Z96652 Presence of left artificial knee joint: Secondary | ICD-10-CM | POA: Diagnosis not present

## 2017-11-07 DIAGNOSIS — M545 Low back pain: Secondary | ICD-10-CM | POA: Diagnosis not present

## 2017-11-07 DIAGNOSIS — G8929 Other chronic pain: Secondary | ICD-10-CM | POA: Diagnosis not present

## 2017-12-25 ENCOUNTER — Encounter: Payer: Self-pay | Admitting: Podiatry

## 2017-12-25 ENCOUNTER — Ambulatory Visit: Payer: PPO | Admitting: Podiatry

## 2017-12-25 DIAGNOSIS — L603 Nail dystrophy: Secondary | ICD-10-CM

## 2017-12-25 DIAGNOSIS — B351 Tinea unguium: Secondary | ICD-10-CM | POA: Diagnosis not present

## 2017-12-25 NOTE — Progress Notes (Signed)
Subjective:  Patient ID: Mark Cowan, male    DOB: 07-Sep-1933,  MRN: [id number] HPI Chief Complaint  Patient presents with  . Nail Problem    Hallux nails bilateral - thick, discolored nails x years, tried Kerasol cream for many months now and noticed made nails soft and look loose from nailbeds  . New Patient (Initial Visit)    84 y.o. male presents with the above complaint.   ROS: Denies fever chills nausea vomiting muscle aches pains calf pain back pain chest pain shortness of breath.  Past Medical History:  Diagnosis Date  . Arthritis   . Diverticulosis    last attack 04/2011  . Elevated lipids   . Hypertension    dr  Clair Gulling   [place]    [place]  . Neuropathy   . Peripheral neuropathy   . Prostate enlargement    Past Surgical History:  Procedure Laterality Date  . CARDIAC CATHETERIZATION     87     stress test   85  . COLON SURGERY     part of colon removed,diseased  . HERNIA REPAIR    . KNEE ARTHROPLASTY Left 11/12/2016   Procedure: COMPUTER ASSISTED TOTAL KNEE ARTHROPLASTY;  Surgeon: Dereck Leep, MD;  Location: [facility] ORS;  Service: Orthopedics;  Laterality: Left;  . LUMBAR LAMINECTOMY/DECOMPRESSION MICRODISCECTOMY  07/26/2011   Procedure: LUMBAR LAMINECTOMY/DECOMPRESSION MICRODISCECTOMY 2 LEVELS;  Surgeon: Ophelia Charter, MD;  Location: [place] NEURO ORS;  Service: Neurosurgery;  Laterality: Bilateral;  Lumbar two to four Laminectomies   . MELANOMA EXCISION     lt arm  . orthrocsopy  lt knee    . TONSILLECTOMY      Current Outpatient Medications:  .  aspirin EC 81 MG tablet, Take 81 mg by mouth daily., Disp: , Rfl:  .  Multiple Vitamins-Minerals (CENTRUM SILVER 50+MEN PO), Take by mouth., Disp: , Rfl:  .  amLODipine (NORVASC) 2.5 MG tablet, Take 2.5 mg by mouth daily., Disp: , Rfl:  .  Cholecalciferol (VITAMIN D3) 2000 units TABS, Take 2,000 Units by mouth daily., Disp: , Rfl:  .  Coenzyme Q10 (CO Q 10) 100 MG CAPS, Take 100 mg by mouth daily., Disp: , Rfl:    .  Cyanocobalamin (VITAMIN B-12) 2500 MCG SUBL, Take 2,500 mcg by mouth daily., Disp: , Rfl:  .  docusate sodium (COLACE) 100 MG capsule, Take 100 mg by mouth every evening., Disp: , Rfl:  .  dutasteride (AVODART) 0.5 MG capsule, Take 0.5 mg by mouth daily., Disp: , Rfl:  .  fenofibrate 160 MG tablet, Take 160 mg by mouth every evening. , Disp: , Rfl:  .  fish oil-omega-3 fatty acids 1000 MG capsule, Take 1 g by mouth 2 (two) times daily., Disp: , Rfl:  .  gabapentin (NEURONTIN) 300 MG capsule, Take 300 mg by mouth every evening. , Disp: , Rfl:  .  Glucosamine-Chondroit-Vit C-Mn (GLUCOSAMINE 1500 COMPLEX PO), Take 1,500 mg by mouth 2 (two) times daily., Disp: , Rfl:  .  losartan-hydrochlorothiazide (HYZAAR) 100-25 MG tablet, Take 1 tablet by mouth daily., Disp: , Rfl:  .  Lysine 500 MG CAPS, Take 500 mg by mouth daily., Disp: , Rfl:  .  rosuvastatin (CRESTOR) 10 MG tablet, Take 10 mg by mouth 2 (two) times a week. Tuesday and Friday, Disp: , Rfl:  .  Tamsulosin HCl (FLOMAX) 0.4 MG CAPS, Take 0.4 mg by mouth every evening. , Disp: , Rfl:  .  vitamin C (ASCORBIC ACID) 500 MG  tablet, Take 500 mg by mouth daily., Disp: , Rfl:   Allergies  Allergen Reactions  . Statins Other (See Comments)    Muscle pain    Review of Systems Objective:   Vitals:   12/25/17 1056  BP: (!) 135/53  Pulse: (!) 52  Resp: 16    General: Well developed, nourished, in no acute distress, alert and oriented x3   Dermatological: Skin is warm, dry and supple bilateral. Nails x 10 are well maintained; remaining integument appears unremarkable at this time. There are no open sores, no preulcerative lesions, no rash or signs of infection present.  His toenails are thick yellow dystrophic possibly mycotic.  There is distal Onikul lysis.  Vascular: Dorsalis Pedis artery and Posterior Tibial artery pedal pulses are 2/4 bilateral with immedate capillary fill time. Pedal hair growth present. No varicosities and no lower  extremity edema present bilateral.   Neruologic: Grossly intact via light touch bilateral. Vibratory intact via tuning fork bilateral. Protective threshold with Semmes Wienstein monofilament intact to all pedal sites bilateral. Patellar and Achilles deep tendon reflexes 2+ bilateral. No Babinski or clonus noted bilateral.   Musculoskeletal: No gross boney pedal deformities bilateral. No pain, crepitus, or limitation noted with foot and ankle range of motion bilateral. Muscular strength 5/5 in all groups tested bilateral.  Gait: Unassisted, Nonantalgic.    Radiographs:  None taken  Assessment & Plan:   Assessment: Nail dystrophy possible onychomycosis.  Plan: All the toenails were debrided today but samples were taken to be sent for pathologic evaluation.  I will follow-up with him in 1 month.     Max T. Pinson, [place]

## 2018-01-22 ENCOUNTER — Encounter: Payer: Self-pay | Admitting: Podiatry

## 2018-01-22 ENCOUNTER — Ambulatory Visit: Payer: PPO | Admitting: Podiatry

## 2018-01-22 DIAGNOSIS — Z79899 Other long term (current) drug therapy: Secondary | ICD-10-CM | POA: Diagnosis not present

## 2018-01-22 DIAGNOSIS — L603 Nail dystrophy: Secondary | ICD-10-CM | POA: Diagnosis not present

## 2018-01-22 MED ORDER — TERBINAFINE HCL 250 MG PO TABS: 250.0000 mg | ORAL_TABLET | Freq: Every day | ORAL | 0 refills | Status: DC

## 2018-01-22 NOTE — Progress Notes (Signed)
He presents today for follow-up of his nail fungus.  Objective: Discussed his pathology results today which do demonstrate onychomycosis with a very common type of fungus.  I also discussed past medical history medications allergy surgeries and social history.  Pulses are palpable neurologic sensorium is intact degenerative flexors are intact muscle strength is normal symmetrical.  No open lesions or wounds.  Toenails are thick yellow dystrophic-like mycotic.  Assessment: Onychomycosis.  Plan: Discussed both the topical laser option as well as oral options.  At this point he would like to pursue the oral option.  I discussed this with him in great detail discussing with possible acute side effects long-term side effects.  He understands and is amenable to and willing to take the risk.  Placed him on 30 tablets of Lamisil 1 tablet daily and also requested blood work and I will follow-up with him in 1 month should his blood work come back abnormal we will follow-up with him immediately.

## 2018-01-23 ENCOUNTER — Telehealth: Payer: Self-pay | Admitting: *Deleted

## 2018-01-23 LAB — SPECIMEN STATUS REPORT

## 2018-01-23 LAB — HEPATIC FUNCTION PANEL
ALK PHOS: 42 IU/L (ref 39–117)
ALT: 20 IU/L (ref 0–44)
AST: 28 IU/L (ref 0–40)
Albumin: 4.8 g/dL — ABNORMAL HIGH (ref 3.5–4.7)
Bilirubin Total: 0.5 mg/dL (ref 0.0–1.2)
Bilirubin, Direct: 0.15 mg/dL (ref 0.00–0.40)
Total Protein: 7.6 g/dL (ref 6.0–8.5)

## 2018-01-23 NOTE — Telephone Encounter (Signed)
Left message informing pt of Dr. Hyatt's review of results and orders. 

## 2018-01-23 NOTE — Telephone Encounter (Signed)
-----   Message from Garrel Ridgel, [place] sent at 01/23/2018  6:29 AM EDT ----- Blood work looks great and may continue medication.

## 2018-01-30 DIAGNOSIS — I10 Essential (primary) hypertension: Secondary | ICD-10-CM | POA: Diagnosis not present

## 2018-01-30 DIAGNOSIS — Z125 Encounter for screening for malignant neoplasm of prostate: Secondary | ICD-10-CM | POA: Diagnosis not present

## 2018-01-30 DIAGNOSIS — E119 Type 2 diabetes mellitus without complications: Secondary | ICD-10-CM | POA: Diagnosis not present

## 2018-02-06 DIAGNOSIS — Z0000 Encounter for general adult medical examination without abnormal findings: Secondary | ICD-10-CM | POA: Diagnosis not present

## 2018-02-06 DIAGNOSIS — I10 Essential (primary) hypertension: Secondary | ICD-10-CM | POA: Diagnosis not present

## 2018-02-06 DIAGNOSIS — Z125 Encounter for screening for malignant neoplasm of prostate: Secondary | ICD-10-CM | POA: Diagnosis not present

## 2018-02-06 DIAGNOSIS — E785 Hyperlipidemia, unspecified: Secondary | ICD-10-CM | POA: Diagnosis not present

## 2018-02-06 DIAGNOSIS — Z23 Encounter for immunization: Secondary | ICD-10-CM | POA: Diagnosis not present

## 2018-02-17 DIAGNOSIS — R351 Nocturia: Secondary | ICD-10-CM | POA: Diagnosis not present

## 2018-02-17 DIAGNOSIS — N401 Enlarged prostate with lower urinary tract symptoms: Secondary | ICD-10-CM | POA: Diagnosis not present

## 2018-02-26 ENCOUNTER — Encounter: Payer: Self-pay | Admitting: Podiatry

## 2018-02-26 ENCOUNTER — Ambulatory Visit: Payer: PPO | Admitting: Podiatry

## 2018-02-26 DIAGNOSIS — Z79899 Other long term (current) drug therapy: Secondary | ICD-10-CM | POA: Diagnosis not present

## 2018-02-26 DIAGNOSIS — L603 Nail dystrophy: Secondary | ICD-10-CM

## 2018-02-26 MED ORDER — TERBINAFINE HCL 250 MG PO TABS: 250.0000 mg | ORAL_TABLET | Freq: Every day | ORAL | 0 refills | Status: DC

## 2018-02-26 NOTE — Progress Notes (Signed)
He presents today for follow-up of his onychomycosis and long-term therapy with Lamisil.  He denies fever chills nausea vomiting muscle aches pains denies rashes or itching.  States he had no problems taking medication.  Objective: No change in physical exam.  Assessment: Long-term therapy for onychomycosis with Lamisil.  Plan: At this point we will requested another liver profile and ice went ahead and start him on his next 90-day dose of Lamisil.  Should his blood work come back abnormal I will notify him immediately.

## 2018-02-27 LAB — HEPATIC FUNCTION PANEL
ALBUMIN: 4.6 g/dL (ref 3.5–4.7)
ALK PHOS: 44 IU/L (ref 39–117)
ALT: 19 IU/L (ref 0–44)
AST: 30 IU/L (ref 0–40)
BILIRUBIN TOTAL: 0.4 mg/dL (ref 0.0–1.2)
BILIRUBIN, DIRECT: 0.13 mg/dL (ref 0.00–0.40)
TOTAL PROTEIN: 7.4 g/dL (ref 6.0–8.5)

## 2018-03-03 ENCOUNTER — Telehealth: Payer: Self-pay | Admitting: *Deleted

## 2018-03-03 NOTE — Telephone Encounter (Signed)
-----   Message from Garrel Ridgel, [place] sent at 02/27/2018  6:45 AM EST ----- Blood work looks good and may continue medication.

## 2018-03-03 NOTE — Telephone Encounter (Signed)
Unable to leave a message on home or work phone.

## 2018-03-04 NOTE — Telephone Encounter (Signed)
I informed pt of Dr. Hyatt's review of results and orders. 

## 2018-03-04 NOTE — Telephone Encounter (Signed)
-----   Message from Garrel Ridgel, [place] sent at 02/27/2018  6:45 AM EST ----- Blood work looks good and may continue medication.

## 2018-05-16 ENCOUNTER — Other Ambulatory Visit: Payer: Self-pay | Admitting: Podiatry

## 2018-05-27 ENCOUNTER — Telehealth: Payer: Self-pay

## 2018-05-27 DIAGNOSIS — L821 Other seborrheic keratosis: Secondary | ICD-10-CM | POA: Diagnosis not present

## 2018-05-27 DIAGNOSIS — D2262 Melanocytic nevi of left upper limb, including shoulder: Secondary | ICD-10-CM | POA: Diagnosis not present

## 2018-05-27 DIAGNOSIS — Z85820 Personal history of malignant melanoma of skin: Secondary | ICD-10-CM | POA: Diagnosis not present

## 2018-05-27 DIAGNOSIS — L814 Other melanin hyperpigmentation: Secondary | ICD-10-CM | POA: Diagnosis not present

## 2018-05-27 DIAGNOSIS — D225 Melanocytic nevi of trunk: Secondary | ICD-10-CM | POA: Diagnosis not present

## 2018-05-27 DIAGNOSIS — D1801 Hemangioma of skin and subcutaneous tissue: Secondary | ICD-10-CM | POA: Diagnosis not present

## 2018-05-27 DIAGNOSIS — B351 Tinea unguium: Secondary | ICD-10-CM | POA: Diagnosis not present

## 2018-05-27 DIAGNOSIS — D485 Neoplasm of uncertain behavior of skin: Secondary | ICD-10-CM | POA: Diagnosis not present

## 2018-05-27 DIAGNOSIS — Z85828 Personal history of other malignant neoplasm of skin: Secondary | ICD-10-CM | POA: Diagnosis not present

## 2018-05-27 DIAGNOSIS — D034 Melanoma in situ of scalp and neck: Secondary | ICD-10-CM | POA: Diagnosis not present

## 2018-05-27 DIAGNOSIS — D2271 Melanocytic nevi of right lower limb, including hip: Secondary | ICD-10-CM | POA: Diagnosis not present

## 2018-05-27 DIAGNOSIS — L570 Actinic keratosis: Secondary | ICD-10-CM | POA: Diagnosis not present

## 2018-05-27 NOTE — Telephone Encounter (Signed)
-----   Message from Rip Harbour, [facility] sent at 05/27/2018 11:53 AM EST ----- I attempted to call this patient to let him know he does not need a refill on his lamisil until he sees Dr. Milinda Pointer on 3/11, but his phone continues to be busy. Can you try and give him a call maybe later this afternoon??  Thanks!! ----- Message ----- From: Arlyce Dice Sent: 05/26/2018   1:50 PM EST To: Rip Harbour, [facility]  Patient called and wanted to know if he was supposed to receive a refill on prescription "Lamisil" or if Dr.Hyatt no longer wants him to take the medication? If you could let him know one or the other that would be great!  Thankyou :)

## 2018-05-27 NOTE — Telephone Encounter (Signed)
I called patient and left message that per Dr. Milinda Pointer, no refill needed until he his seen on 06/25/2018

## 2018-06-02 DIAGNOSIS — Z85820 Personal history of malignant melanoma of skin: Secondary | ICD-10-CM | POA: Diagnosis not present

## 2018-06-02 DIAGNOSIS — Z85828 Personal history of other malignant neoplasm of skin: Secondary | ICD-10-CM | POA: Diagnosis not present

## 2018-06-02 DIAGNOSIS — D034 Melanoma in situ of scalp and neck: Secondary | ICD-10-CM | POA: Diagnosis not present

## 2018-06-02 DIAGNOSIS — L988 Other specified disorders of the skin and subcutaneous tissue: Secondary | ICD-10-CM | POA: Diagnosis not present

## 2018-06-25 ENCOUNTER — Other Ambulatory Visit: Payer: Self-pay

## 2018-06-25 ENCOUNTER — Encounter: Payer: Self-pay | Admitting: Podiatry

## 2018-06-25 ENCOUNTER — Ambulatory Visit: Payer: PPO | Admitting: Podiatry

## 2018-06-25 DIAGNOSIS — L603 Nail dystrophy: Secondary | ICD-10-CM | POA: Diagnosis not present

## 2018-06-25 MED ORDER — TERBINAFINE HCL 250 MG PO TABS: 250.0000 mg | ORAL_TABLET | Freq: Every day | ORAL | 0 refills | Status: DC

## 2018-06-25 NOTE — Progress Notes (Signed)
He presents today for follow-up of his nail fungus he is completed 120 days of Lamisil states that I hoped it looking better.  Objective: Vital signs are stable alert and oriented x3.  Nails are healing very nicely.  Assessment: Healing onychomycosis long-term therapy.  Plan: Number to request that he take another every other day dose and I will follow-up with him in 3 months.

## 2018-06-25 NOTE — Patient Instructions (Signed)
Dr. Hyatt has sent over a refill for Lamisil to your pharmacy today. The instructions on your bottle will say "take 1 tablet daily", however, he would like for you to take one pill every other day. He will follow up with you in 3 months to re-evaluate your toenails. 

## 2018-09-18 DIAGNOSIS — H25812 Combined forms of age-related cataract, left eye: Secondary | ICD-10-CM | POA: Diagnosis not present

## 2018-09-18 DIAGNOSIS — H2181 Floppy iris syndrome: Secondary | ICD-10-CM | POA: Diagnosis not present

## 2018-09-18 DIAGNOSIS — H5709 Other anomalies of pupillary function: Secondary | ICD-10-CM | POA: Diagnosis not present

## 2018-09-18 DIAGNOSIS — H2512 Age-related nuclear cataract, left eye: Secondary | ICD-10-CM | POA: Diagnosis not present

## 2018-09-24 ENCOUNTER — Encounter: Payer: Self-pay | Admitting: Podiatry

## 2018-09-24 ENCOUNTER — Other Ambulatory Visit: Payer: Self-pay

## 2018-09-24 ENCOUNTER — Ambulatory Visit: Payer: PPO | Admitting: Podiatry

## 2018-09-24 VITALS — Temp 98.2°F

## 2018-09-24 DIAGNOSIS — L603 Nail dystrophy: Secondary | ICD-10-CM

## 2018-09-24 MED ORDER — TERBINAFINE HCL 250 MG PO TABS: 250.0000 mg | ORAL_TABLET | Freq: Every day | ORAL | 0 refills | Status: DC

## 2018-09-24 NOTE — Patient Instructions (Signed)
Dr. Hyatt has sent over a refill for Lamisil to your pharmacy today. The instructions on your bottle will say "take 1 tablet daily", however, he would like for you to take one pill every other day. He will follow up with you in 3 months to re-evaluate your toenails. 

## 2018-09-24 NOTE — Progress Notes (Signed)
He states that he is doing good overall and I am done with the Lamisil but my wife notices that there is still some thickening of the toenail.  He denies any problems taking the medication no rashes no fever chest pain calf pain muscle pain rashes.  Objective: Vital signs are stable he is alert and oriented x3.  Nail dystrophy's appear to be growing out quite nicely he is nearly 100% grown out though he does retain a small ridge of mycosis right at the end of the toenails.  Assessment: Onychomycosis is nearly completely grown out with long-term use of Lamisil.  Plan: At this point I would like to go ahead and have him take another every other day dose I think this will be the last dose we have to take and he should be 100% cleared up.  I will follow-up with him in 3 months

## 2018-10-02 DIAGNOSIS — H25811 Combined forms of age-related cataract, right eye: Secondary | ICD-10-CM | POA: Diagnosis not present

## 2018-10-02 DIAGNOSIS — H2181 Floppy iris syndrome: Secondary | ICD-10-CM | POA: Diagnosis not present

## 2018-10-02 DIAGNOSIS — H21561 Pupillary abnormality, right eye: Secondary | ICD-10-CM | POA: Diagnosis not present

## 2018-10-02 DIAGNOSIS — H2511 Age-related nuclear cataract, right eye: Secondary | ICD-10-CM | POA: Diagnosis not present

## 2018-10-31 DIAGNOSIS — Z85828 Personal history of other malignant neoplasm of skin: Secondary | ICD-10-CM | POA: Diagnosis not present

## 2018-10-31 DIAGNOSIS — Z85820 Personal history of malignant melanoma of skin: Secondary | ICD-10-CM | POA: Diagnosis not present

## 2018-10-31 DIAGNOSIS — D0339 Melanoma in situ of other parts of face: Secondary | ICD-10-CM | POA: Diagnosis not present

## 2018-10-31 DIAGNOSIS — D485 Neoplasm of uncertain behavior of skin: Secondary | ICD-10-CM | POA: Diagnosis not present

## 2018-11-13 DIAGNOSIS — Z85820 Personal history of malignant melanoma of skin: Secondary | ICD-10-CM | POA: Diagnosis not present

## 2018-11-13 DIAGNOSIS — D0339 Melanoma in situ of other parts of face: Secondary | ICD-10-CM | POA: Diagnosis not present

## 2018-11-13 DIAGNOSIS — Z85828 Personal history of other malignant neoplasm of skin: Secondary | ICD-10-CM | POA: Diagnosis not present

## 2018-11-14 DIAGNOSIS — Z96652 Presence of left artificial knee joint: Secondary | ICD-10-CM | POA: Diagnosis not present

## 2018-11-14 DIAGNOSIS — M1712 Unilateral primary osteoarthritis, left knee: Secondary | ICD-10-CM | POA: Diagnosis not present

## 2018-11-20 DIAGNOSIS — Z4802 Encounter for removal of sutures: Secondary | ICD-10-CM | POA: Diagnosis not present

## 2018-11-24 DIAGNOSIS — R197 Diarrhea, unspecified: Secondary | ICD-10-CM | POA: Diagnosis not present

## 2018-11-25 DIAGNOSIS — R197 Diarrhea, unspecified: Secondary | ICD-10-CM | POA: Diagnosis not present

## 2018-12-08 DIAGNOSIS — K591 Functional diarrhea: Secondary | ICD-10-CM | POA: Diagnosis not present

## 2018-12-08 DIAGNOSIS — E782 Mixed hyperlipidemia: Secondary | ICD-10-CM | POA: Diagnosis not present

## 2018-12-08 DIAGNOSIS — K5730 Diverticulosis of large intestine without perforation or abscess without bleeding: Secondary | ICD-10-CM | POA: Diagnosis not present

## 2018-12-08 DIAGNOSIS — Z96652 Presence of left artificial knee joint: Secondary | ICD-10-CM | POA: Diagnosis not present

## 2018-12-08 DIAGNOSIS — G609 Hereditary and idiopathic neuropathy, unspecified: Secondary | ICD-10-CM | POA: Diagnosis not present

## 2018-12-08 DIAGNOSIS — Z8719 Personal history of other diseases of the digestive system: Secondary | ICD-10-CM | POA: Diagnosis not present

## 2018-12-08 DIAGNOSIS — I10 Essential (primary) hypertension: Secondary | ICD-10-CM | POA: Diagnosis not present

## 2018-12-31 ENCOUNTER — Ambulatory Visit: Payer: PPO | Admitting: Podiatry

## 2018-12-31 ENCOUNTER — Encounter: Payer: Self-pay | Admitting: Podiatry

## 2018-12-31 ENCOUNTER — Other Ambulatory Visit: Payer: Self-pay

## 2018-12-31 DIAGNOSIS — L603 Nail dystrophy: Secondary | ICD-10-CM | POA: Diagnosis not present

## 2018-12-31 MED ORDER — TERBINAFINE HCL 250 MG PO TABS: 250.0000 mg | ORAL_TABLET | Freq: Every day | ORAL | 0 refills | Status: DC

## 2018-12-31 NOTE — Progress Notes (Signed)
He presents today for follow-up of his Lamisil states that he is finished his Lamisil his toenails look so much better he is very happy but I feel that maybe 1 more dose will get rid of all of the yellowness.  Objective: Vital signs are stable he is alert and oriented x3.  Toenails are 100% grown out are nearly so.  Assessment: Onychomycosis.  Plan: Continue another dose of Lamisil 30 tablets 1 every other day follow-up with him in 3 months

## 2019-01-02 DIAGNOSIS — J0100 Acute maxillary sinusitis, unspecified: Secondary | ICD-10-CM | POA: Diagnosis not present

## 2019-02-04 DIAGNOSIS — Z125 Encounter for screening for malignant neoplasm of prostate: Secondary | ICD-10-CM | POA: Diagnosis not present

## 2019-02-04 DIAGNOSIS — I10 Essential (primary) hypertension: Secondary | ICD-10-CM | POA: Diagnosis not present

## 2019-02-11 DIAGNOSIS — E785 Hyperlipidemia, unspecified: Secondary | ICD-10-CM | POA: Diagnosis not present

## 2019-02-11 DIAGNOSIS — I10 Essential (primary) hypertension: Secondary | ICD-10-CM | POA: Diagnosis not present

## 2019-02-11 DIAGNOSIS — Z125 Encounter for screening for malignant neoplasm of prostate: Secondary | ICD-10-CM | POA: Diagnosis not present

## 2019-02-11 DIAGNOSIS — Z0000 Encounter for general adult medical examination without abnormal findings: Secondary | ICD-10-CM | POA: Diagnosis not present

## 2019-03-11 IMAGING — DX DG KNEE 1-2V PORT*L*
2 series · 2 of 2 positions shown · non-contrast
Comparison: No recent prior .

CLINICAL DATA: Total left knee replacement.

EXAM:
PORTABLE LEFT KNEE - 1-2 VIEW

[knee ap]
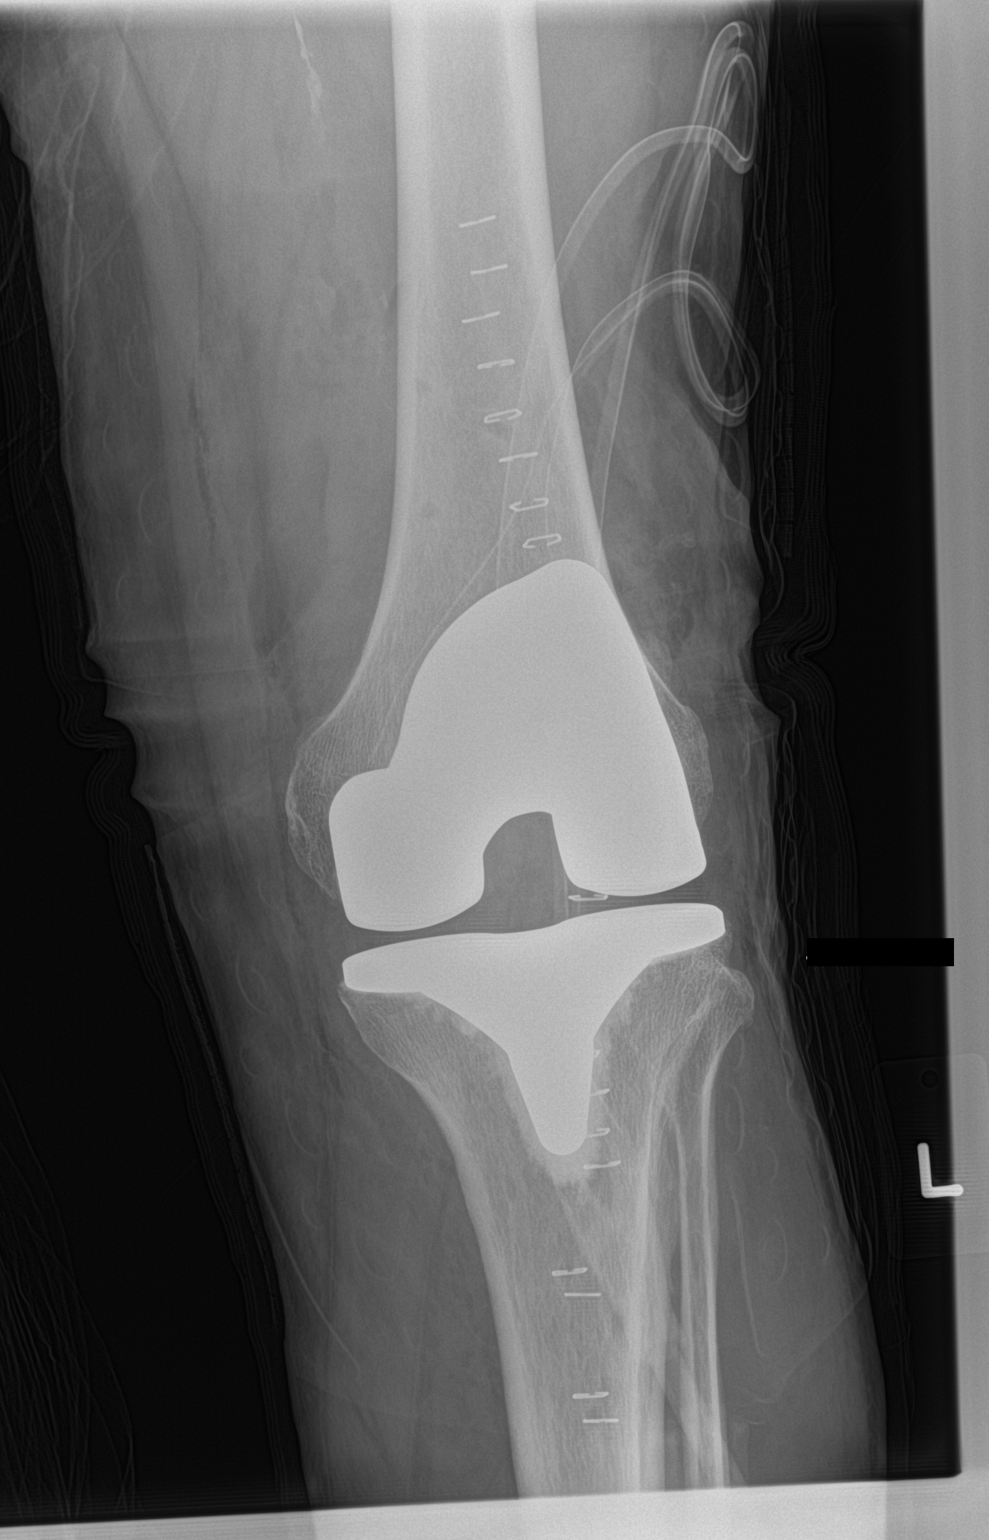

[knee lat]
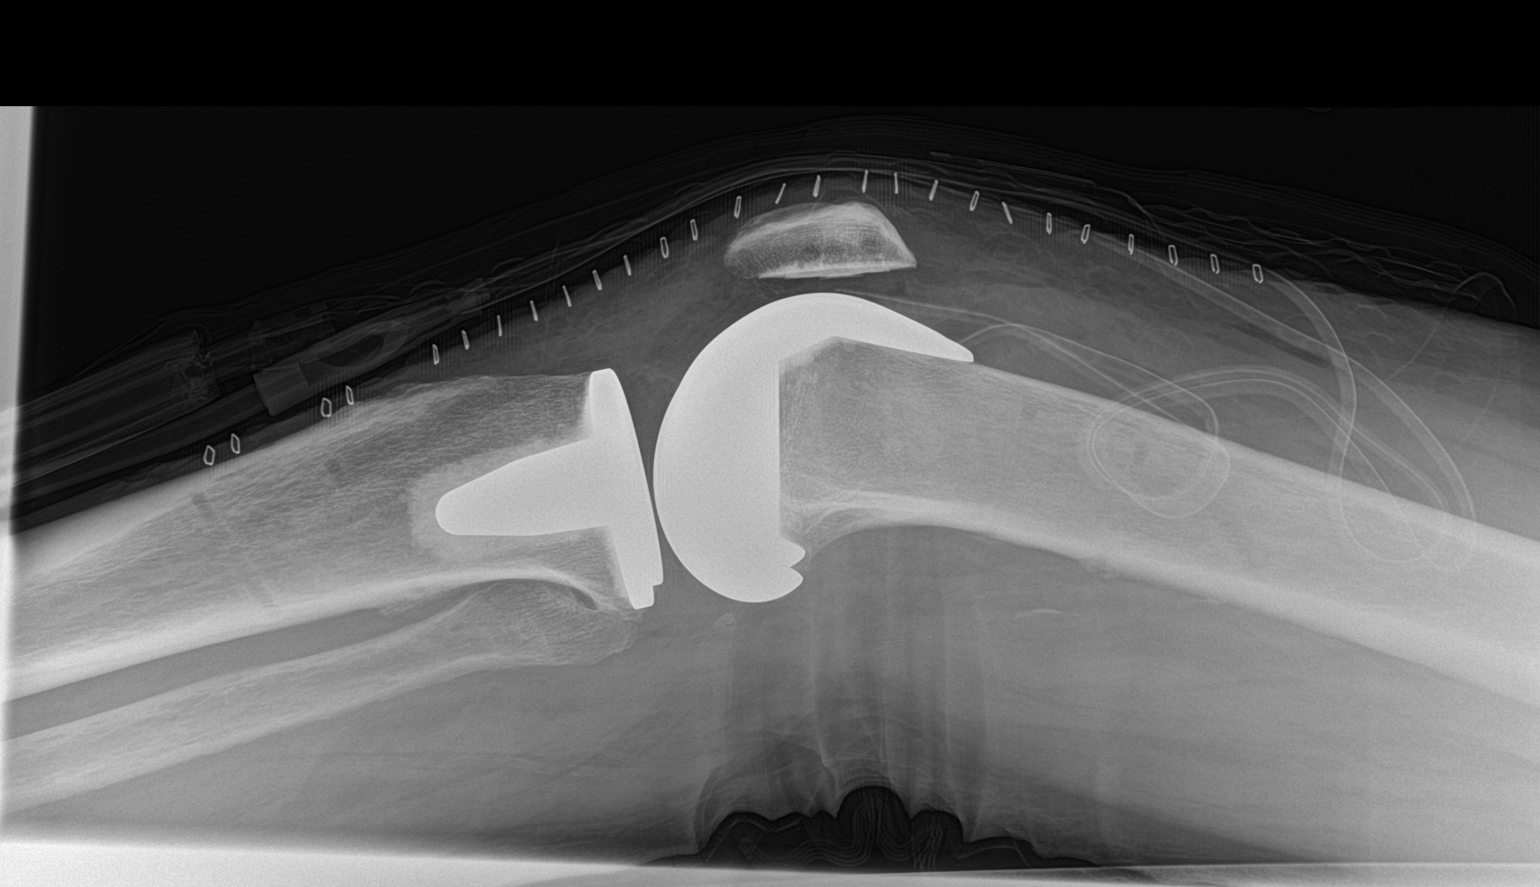

[2 of 2 positions shown; findings below may reference images not displayed]

FINDINGS: Total left knee replacement anatomic alignment. Hardware intact. No
acute bony abnormality.
IMPRESSION: Total left knee replacement anatomic alignment.

## 2019-03-18 DIAGNOSIS — R350 Frequency of micturition: Secondary | ICD-10-CM | POA: Diagnosis not present

## 2019-03-18 DIAGNOSIS — N401 Enlarged prostate with lower urinary tract symptoms: Secondary | ICD-10-CM | POA: Diagnosis not present

## 2019-03-18 DIAGNOSIS — R351 Nocturia: Secondary | ICD-10-CM | POA: Diagnosis not present

## 2019-04-01 ENCOUNTER — Other Ambulatory Visit: Payer: Self-pay

## 2019-04-01 ENCOUNTER — Encounter: Payer: Self-pay | Admitting: Podiatry

## 2019-04-01 ENCOUNTER — Ambulatory Visit: Payer: PPO | Admitting: Podiatry

## 2019-04-01 DIAGNOSIS — L603 Nail dystrophy: Secondary | ICD-10-CM

## 2019-04-01 NOTE — Progress Notes (Signed)
He presents today for follow-up of his Lamisil therapy.  States that is doing really well.  He states that I finished my medicine and have had no problems with it.  Objective: Vital signs are stable he is alert and oriented x3.  Pulses are palpable.  Nails appear to be 100% grown out.  Assessment: Long-term therapy with Lamisil nearly 100% success.  Plan: Follow-up with [place] as needed.

## 2019-05-12 DIAGNOSIS — L821 Other seborrheic keratosis: Secondary | ICD-10-CM | POA: Diagnosis not present

## 2019-05-12 DIAGNOSIS — D1801 Hemangioma of skin and subcutaneous tissue: Secondary | ICD-10-CM | POA: Diagnosis not present

## 2019-05-12 DIAGNOSIS — D1721 Benign lipomatous neoplasm of skin and subcutaneous tissue of right arm: Secondary | ICD-10-CM | POA: Diagnosis not present

## 2019-05-12 DIAGNOSIS — D485 Neoplasm of uncertain behavior of skin: Secondary | ICD-10-CM | POA: Diagnosis not present

## 2019-05-12 DIAGNOSIS — D1722 Benign lipomatous neoplasm of skin and subcutaneous tissue of left arm: Secondary | ICD-10-CM | POA: Diagnosis not present

## 2019-05-12 DIAGNOSIS — Z85820 Personal history of malignant melanoma of skin: Secondary | ICD-10-CM | POA: Diagnosis not present

## 2019-05-12 DIAGNOSIS — D225 Melanocytic nevi of trunk: Secondary | ICD-10-CM | POA: Diagnosis not present

## 2019-05-12 DIAGNOSIS — L570 Actinic keratosis: Secondary | ICD-10-CM | POA: Diagnosis not present

## 2019-05-12 DIAGNOSIS — L853 Xerosis cutis: Secondary | ICD-10-CM | POA: Diagnosis not present

## 2019-05-12 DIAGNOSIS — Z85828 Personal history of other malignant neoplasm of skin: Secondary | ICD-10-CM | POA: Diagnosis not present

## 2019-11-10 DIAGNOSIS — Z85828 Personal history of other malignant neoplasm of skin: Secondary | ICD-10-CM | POA: Diagnosis not present

## 2019-11-10 DIAGNOSIS — L821 Other seborrheic keratosis: Secondary | ICD-10-CM | POA: Diagnosis not present

## 2019-11-10 DIAGNOSIS — L570 Actinic keratosis: Secondary | ICD-10-CM | POA: Diagnosis not present

## 2019-11-10 DIAGNOSIS — D225 Melanocytic nevi of trunk: Secondary | ICD-10-CM | POA: Diagnosis not present

## 2019-11-10 DIAGNOSIS — Z85820 Personal history of malignant melanoma of skin: Secondary | ICD-10-CM | POA: Diagnosis not present

## 2019-11-10 DIAGNOSIS — D1801 Hemangioma of skin and subcutaneous tissue: Secondary | ICD-10-CM | POA: Diagnosis not present

## 2019-11-19 DIAGNOSIS — Z96652 Presence of left artificial knee joint: Secondary | ICD-10-CM | POA: Diagnosis not present

## 2019-12-31 DIAGNOSIS — H903 Sensorineural hearing loss, bilateral: Secondary | ICD-10-CM | POA: Diagnosis not present

## 2020-02-08 DIAGNOSIS — Z961 Presence of intraocular lens: Secondary | ICD-10-CM | POA: Diagnosis not present

## 2020-02-08 DIAGNOSIS — H524 Presbyopia: Secondary | ICD-10-CM | POA: Diagnosis not present

## 2020-02-08 DIAGNOSIS — H35363 Drusen (degenerative) of macula, bilateral: Secondary | ICD-10-CM | POA: Diagnosis not present

## 2020-02-09 DIAGNOSIS — I10 Essential (primary) hypertension: Secondary | ICD-10-CM | POA: Diagnosis not present

## 2020-02-09 DIAGNOSIS — Z125 Encounter for screening for malignant neoplasm of prostate: Secondary | ICD-10-CM | POA: Diagnosis not present

## 2020-02-15 DIAGNOSIS — I10 Essential (primary) hypertension: Secondary | ICD-10-CM | POA: Diagnosis not present

## 2020-02-15 DIAGNOSIS — E785 Hyperlipidemia, unspecified: Secondary | ICD-10-CM | POA: Diagnosis not present

## 2020-02-15 DIAGNOSIS — D649 Anemia, unspecified: Secondary | ICD-10-CM | POA: Diagnosis not present

## 2020-02-15 DIAGNOSIS — R5381 Other malaise: Secondary | ICD-10-CM | POA: Diagnosis not present

## 2020-02-15 DIAGNOSIS — Z0000 Encounter for general adult medical examination without abnormal findings: Secondary | ICD-10-CM | POA: Diagnosis not present

## 2020-02-15 DIAGNOSIS — Z125 Encounter for screening for malignant neoplasm of prostate: Secondary | ICD-10-CM | POA: Diagnosis not present

## 2020-02-15 DIAGNOSIS — R5383 Other fatigue: Secondary | ICD-10-CM | POA: Diagnosis not present

## 2020-02-15 DIAGNOSIS — E538 Deficiency of other specified B group vitamins: Secondary | ICD-10-CM | POA: Diagnosis not present

## 2020-03-23 DIAGNOSIS — Z79899 Other long term (current) drug therapy: Secondary | ICD-10-CM | POA: Diagnosis not present

## 2020-03-23 DIAGNOSIS — Z125 Encounter for screening for malignant neoplasm of prostate: Secondary | ICD-10-CM | POA: Diagnosis not present

## 2020-03-23 DIAGNOSIS — N401 Enlarged prostate with lower urinary tract symptoms: Secondary | ICD-10-CM | POA: Diagnosis not present

## 2020-05-10 DIAGNOSIS — H6123 Impacted cerumen, bilateral: Secondary | ICD-10-CM | POA: Diagnosis not present

## 2020-05-10 DIAGNOSIS — H903 Sensorineural hearing loss, bilateral: Secondary | ICD-10-CM | POA: Diagnosis not present

## 2020-05-12 DIAGNOSIS — Z85820 Personal history of malignant melanoma of skin: Secondary | ICD-10-CM | POA: Diagnosis not present

## 2020-05-12 DIAGNOSIS — L570 Actinic keratosis: Secondary | ICD-10-CM | POA: Diagnosis not present

## 2020-05-12 DIAGNOSIS — L821 Other seborrheic keratosis: Secondary | ICD-10-CM | POA: Diagnosis not present

## 2020-05-12 DIAGNOSIS — D1801 Hemangioma of skin and subcutaneous tissue: Secondary | ICD-10-CM | POA: Diagnosis not present

## 2020-05-12 DIAGNOSIS — Z85828 Personal history of other malignant neoplasm of skin: Secondary | ICD-10-CM | POA: Diagnosis not present

## 2020-05-12 DIAGNOSIS — L918 Other hypertrophic disorders of the skin: Secondary | ICD-10-CM | POA: Diagnosis not present

## 2020-05-12 DIAGNOSIS — D1722 Benign lipomatous neoplasm of skin and subcutaneous tissue of left arm: Secondary | ICD-10-CM | POA: Diagnosis not present

## 2020-05-12 DIAGNOSIS — D2271 Melanocytic nevi of right lower limb, including hip: Secondary | ICD-10-CM | POA: Diagnosis not present

## 2020-11-09 DIAGNOSIS — L918 Other hypertrophic disorders of the skin: Secondary | ICD-10-CM | POA: Diagnosis not present

## 2020-11-09 DIAGNOSIS — L814 Other melanin hyperpigmentation: Secondary | ICD-10-CM | POA: Diagnosis not present

## 2020-11-09 DIAGNOSIS — D225 Melanocytic nevi of trunk: Secondary | ICD-10-CM | POA: Diagnosis not present

## 2020-11-09 DIAGNOSIS — L570 Actinic keratosis: Secondary | ICD-10-CM | POA: Diagnosis not present

## 2020-11-09 DIAGNOSIS — D2271 Melanocytic nevi of right lower limb, including hip: Secondary | ICD-10-CM | POA: Diagnosis not present

## 2020-11-09 DIAGNOSIS — L821 Other seborrheic keratosis: Secondary | ICD-10-CM | POA: Diagnosis not present

## 2020-11-09 DIAGNOSIS — Z85820 Personal history of malignant melanoma of skin: Secondary | ICD-10-CM | POA: Diagnosis not present

## 2020-11-09 DIAGNOSIS — D1722 Benign lipomatous neoplasm of skin and subcutaneous tissue of left arm: Secondary | ICD-10-CM | POA: Diagnosis not present

## 2020-11-09 DIAGNOSIS — Z85828 Personal history of other malignant neoplasm of skin: Secondary | ICD-10-CM | POA: Diagnosis not present

## 2020-11-22 DIAGNOSIS — Z96652 Presence of left artificial knee joint: Secondary | ICD-10-CM | POA: Diagnosis not present

## 2020-11-30 DIAGNOSIS — S90861A Insect bite (nonvenomous), right foot, initial encounter: Secondary | ICD-10-CM | POA: Diagnosis not present

## 2020-11-30 DIAGNOSIS — W57XXXA Bitten or stung by nonvenomous insect and other nonvenomous arthropods, initial encounter: Secondary | ICD-10-CM | POA: Diagnosis not present

## 2021-02-14 DIAGNOSIS — Z125 Encounter for screening for malignant neoplasm of prostate: Secondary | ICD-10-CM | POA: Diagnosis not present

## 2021-02-14 DIAGNOSIS — E785 Hyperlipidemia, unspecified: Secondary | ICD-10-CM | POA: Diagnosis not present

## 2021-02-14 DIAGNOSIS — I10 Essential (primary) hypertension: Secondary | ICD-10-CM | POA: Diagnosis not present

## 2021-02-15 DIAGNOSIS — I10 Essential (primary) hypertension: Secondary | ICD-10-CM | POA: Diagnosis not present

## 2021-02-15 DIAGNOSIS — Z125 Encounter for screening for malignant neoplasm of prostate: Secondary | ICD-10-CM | POA: Diagnosis not present

## 2021-02-15 DIAGNOSIS — L219 Seborrheic dermatitis, unspecified: Secondary | ICD-10-CM | POA: Diagnosis not present

## 2021-02-15 DIAGNOSIS — E782 Mixed hyperlipidemia: Secondary | ICD-10-CM | POA: Diagnosis not present

## 2021-02-15 DIAGNOSIS — Z0000 Encounter for general adult medical examination without abnormal findings: Secondary | ICD-10-CM | POA: Diagnosis not present

## 2021-02-15 DIAGNOSIS — G609 Hereditary and idiopathic neuropathy, unspecified: Secondary | ICD-10-CM | POA: Diagnosis not present

## 2021-03-27 DIAGNOSIS — N401 Enlarged prostate with lower urinary tract symptoms: Secondary | ICD-10-CM | POA: Diagnosis not present

## 2021-03-27 DIAGNOSIS — Z79899 Other long term (current) drug therapy: Secondary | ICD-10-CM | POA: Diagnosis not present

## 2021-04-04 DIAGNOSIS — H524 Presbyopia: Secondary | ICD-10-CM | POA: Diagnosis not present

## 2021-04-04 DIAGNOSIS — Z961 Presence of intraocular lens: Secondary | ICD-10-CM | POA: Diagnosis not present

## 2021-05-15 DIAGNOSIS — L853 Xerosis cutis: Secondary | ICD-10-CM | POA: Diagnosis not present

## 2021-05-15 DIAGNOSIS — D225 Melanocytic nevi of trunk: Secondary | ICD-10-CM | POA: Diagnosis not present

## 2021-05-15 DIAGNOSIS — L570 Actinic keratosis: Secondary | ICD-10-CM | POA: Diagnosis not present

## 2021-05-15 DIAGNOSIS — L814 Other melanin hyperpigmentation: Secondary | ICD-10-CM | POA: Diagnosis not present

## 2021-05-15 DIAGNOSIS — L821 Other seborrheic keratosis: Secondary | ICD-10-CM | POA: Diagnosis not present

## 2021-05-15 DIAGNOSIS — D1801 Hemangioma of skin and subcutaneous tissue: Secondary | ICD-10-CM | POA: Diagnosis not present

## 2021-05-15 DIAGNOSIS — Z85820 Personal history of malignant melanoma of skin: Secondary | ICD-10-CM | POA: Diagnosis not present

## 2021-05-15 DIAGNOSIS — D1722 Benign lipomatous neoplasm of skin and subcutaneous tissue of left arm: Secondary | ICD-10-CM | POA: Diagnosis not present

## 2021-05-15 DIAGNOSIS — Z85828 Personal history of other malignant neoplasm of skin: Secondary | ICD-10-CM | POA: Diagnosis not present

## 2021-07-04 DIAGNOSIS — Z85820 Personal history of malignant melanoma of skin: Secondary | ICD-10-CM | POA: Diagnosis not present

## 2021-07-04 DIAGNOSIS — L820 Inflamed seborrheic keratosis: Secondary | ICD-10-CM | POA: Diagnosis not present

## 2021-07-04 DIAGNOSIS — Z85828 Personal history of other malignant neoplasm of skin: Secondary | ICD-10-CM | POA: Diagnosis not present

## 2021-11-20 DIAGNOSIS — D1722 Benign lipomatous neoplasm of skin and subcutaneous tissue of left arm: Secondary | ICD-10-CM | POA: Diagnosis not present

## 2021-11-20 DIAGNOSIS — Z85828 Personal history of other malignant neoplasm of skin: Secondary | ICD-10-CM | POA: Diagnosis not present

## 2021-11-20 DIAGNOSIS — D3617 Benign neoplasm of peripheral nerves and autonomic nervous system of trunk, unspecified: Secondary | ICD-10-CM | POA: Diagnosis not present

## 2021-11-20 DIAGNOSIS — Z85820 Personal history of malignant melanoma of skin: Secondary | ICD-10-CM | POA: Diagnosis not present

## 2021-11-20 DIAGNOSIS — L570 Actinic keratosis: Secondary | ICD-10-CM | POA: Diagnosis not present

## 2021-11-20 DIAGNOSIS — L918 Other hypertrophic disorders of the skin: Secondary | ICD-10-CM | POA: Diagnosis not present

## 2021-11-20 DIAGNOSIS — L814 Other melanin hyperpigmentation: Secondary | ICD-10-CM | POA: Diagnosis not present

## 2021-11-20 DIAGNOSIS — L821 Other seborrheic keratosis: Secondary | ICD-10-CM | POA: Diagnosis not present

## 2021-11-20 DIAGNOSIS — D2262 Melanocytic nevi of left upper limb, including shoulder: Secondary | ICD-10-CM | POA: Diagnosis not present

## 2021-11-20 DIAGNOSIS — D1801 Hemangioma of skin and subcutaneous tissue: Secondary | ICD-10-CM | POA: Diagnosis not present

## 2021-12-22 DIAGNOSIS — M25551 Pain in right hip: Secondary | ICD-10-CM | POA: Diagnosis not present

## 2021-12-22 DIAGNOSIS — G8929 Other chronic pain: Secondary | ICD-10-CM | POA: Diagnosis not present

## 2021-12-22 DIAGNOSIS — M160 Bilateral primary osteoarthritis of hip: Secondary | ICD-10-CM | POA: Diagnosis not present

## 2021-12-22 DIAGNOSIS — M1711 Unilateral primary osteoarthritis, right knee: Secondary | ICD-10-CM | POA: Diagnosis not present

## 2021-12-22 DIAGNOSIS — M25561 Pain in right knee: Secondary | ICD-10-CM | POA: Diagnosis not present

## 2022-01-15 DIAGNOSIS — M1711 Unilateral primary osteoarthritis, right knee: Secondary | ICD-10-CM | POA: Diagnosis not present

## 2022-02-13 DIAGNOSIS — I10 Essential (primary) hypertension: Secondary | ICD-10-CM | POA: Diagnosis not present

## 2022-02-13 DIAGNOSIS — R7309 Other abnormal glucose: Secondary | ICD-10-CM | POA: Diagnosis not present

## 2022-02-13 DIAGNOSIS — E782 Mixed hyperlipidemia: Secondary | ICD-10-CM | POA: Diagnosis not present

## 2022-02-13 DIAGNOSIS — Z125 Encounter for screening for malignant neoplasm of prostate: Secondary | ICD-10-CM | POA: Diagnosis not present

## 2022-02-20 DIAGNOSIS — Z96652 Presence of left artificial knee joint: Secondary | ICD-10-CM | POA: Diagnosis not present

## 2022-02-22 DIAGNOSIS — Z0000 Encounter for general adult medical examination without abnormal findings: Secondary | ICD-10-CM | POA: Diagnosis not present

## 2022-02-22 DIAGNOSIS — G609 Hereditary and idiopathic neuropathy, unspecified: Secondary | ICD-10-CM | POA: Diagnosis not present

## 2022-02-22 DIAGNOSIS — I10 Essential (primary) hypertension: Secondary | ICD-10-CM | POA: Diagnosis not present

## 2022-02-22 DIAGNOSIS — D649 Anemia, unspecified: Secondary | ICD-10-CM | POA: Diagnosis not present

## 2022-02-22 DIAGNOSIS — Z125 Encounter for screening for malignant neoplasm of prostate: Secondary | ICD-10-CM | POA: Diagnosis not present

## 2022-02-22 DIAGNOSIS — R7303 Prediabetes: Secondary | ICD-10-CM | POA: Diagnosis not present

## 2022-02-22 DIAGNOSIS — E782 Mixed hyperlipidemia: Secondary | ICD-10-CM | POA: Diagnosis not present

## 2022-04-03 DIAGNOSIS — N5201 Erectile dysfunction due to arterial insufficiency: Secondary | ICD-10-CM | POA: Diagnosis not present

## 2022-04-03 DIAGNOSIS — N401 Enlarged prostate with lower urinary tract symptoms: Secondary | ICD-10-CM | POA: Diagnosis not present

## 2022-04-03 DIAGNOSIS — Z79899 Other long term (current) drug therapy: Secondary | ICD-10-CM | POA: Diagnosis not present

## 2022-04-25 DIAGNOSIS — H52203 Unspecified astigmatism, bilateral: Secondary | ICD-10-CM | POA: Diagnosis not present

## 2022-04-25 DIAGNOSIS — Z961 Presence of intraocular lens: Secondary | ICD-10-CM | POA: Diagnosis not present

## 2022-05-28 DIAGNOSIS — L989 Disorder of the skin and subcutaneous tissue, unspecified: Secondary | ICD-10-CM | POA: Diagnosis not present

## 2022-05-28 DIAGNOSIS — L821 Other seborrheic keratosis: Secondary | ICD-10-CM | POA: Diagnosis not present

## 2022-06-14 DIAGNOSIS — D1722 Benign lipomatous neoplasm of skin and subcutaneous tissue of left arm: Secondary | ICD-10-CM | POA: Diagnosis not present

## 2022-06-14 DIAGNOSIS — L570 Actinic keratosis: Secondary | ICD-10-CM | POA: Diagnosis not present

## 2022-06-14 DIAGNOSIS — Z85820 Personal history of malignant melanoma of skin: Secondary | ICD-10-CM | POA: Diagnosis not present

## 2022-06-14 DIAGNOSIS — L918 Other hypertrophic disorders of the skin: Secondary | ICD-10-CM | POA: Diagnosis not present

## 2022-06-14 DIAGNOSIS — Z85828 Personal history of other malignant neoplasm of skin: Secondary | ICD-10-CM | POA: Diagnosis not present

## 2022-06-14 DIAGNOSIS — L821 Other seborrheic keratosis: Secondary | ICD-10-CM | POA: Diagnosis not present

## 2022-07-23 DIAGNOSIS — I10 Essential (primary) hypertension: Secondary | ICD-10-CM | POA: Diagnosis not present

## 2022-07-23 DIAGNOSIS — R413 Other amnesia: Secondary | ICD-10-CM | POA: Diagnosis not present

## 2022-09-19 DIAGNOSIS — R001 Bradycardia, unspecified: Secondary | ICD-10-CM | POA: Diagnosis not present

## 2022-09-19 DIAGNOSIS — R29810 Facial weakness: Secondary | ICD-10-CM | POA: Diagnosis not present

## 2022-09-19 DIAGNOSIS — R4189 Other symptoms and signs involving cognitive functions and awareness: Secondary | ICD-10-CM | POA: Diagnosis not present

## 2022-09-19 DIAGNOSIS — H9193 Unspecified hearing loss, bilateral: Secondary | ICD-10-CM | POA: Diagnosis not present

## 2022-09-19 DIAGNOSIS — G3184 Mild cognitive impairment, so stated: Secondary | ICD-10-CM | POA: Diagnosis not present

## 2022-09-24 ENCOUNTER — Other Ambulatory Visit: Payer: Self-pay

## 2022-09-24 DIAGNOSIS — G3184 Mild cognitive impairment, so stated: Secondary | ICD-10-CM

## 2022-09-27 DIAGNOSIS — G3184 Mild cognitive impairment, so stated: Secondary | ICD-10-CM | POA: Diagnosis not present

## 2022-09-27 DIAGNOSIS — Z79899 Other long term (current) drug therapy: Secondary | ICD-10-CM | POA: Diagnosis not present

## 2022-10-01 ENCOUNTER — Ambulatory Visit
Admission: RE | Admit: 2022-10-01 | Discharge: 2022-10-01 | Disposition: A | Discharge status: Home / Self Care | Payer: PPO | Source: Ambulatory Visit | Attending: Neurology | Admitting: Neurology

## 2022-10-01 DIAGNOSIS — G3184 Mild cognitive impairment, so stated: Secondary | ICD-10-CM

## 2022-11-16 ENCOUNTER — Observation Stay
Admission: EM | Admit: 2022-11-16 | Discharge: 2022-11-17 | Disposition: A | Discharge status: Home / Self Care | Payer: PPO | Source: Home / Self Care | Attending: Emergency Medicine | Admitting: Emergency Medicine

## 2022-11-16 ENCOUNTER — Encounter: Payer: Self-pay | Admitting: Intensive Care

## 2022-11-16 ENCOUNTER — Other Ambulatory Visit: Payer: Self-pay

## 2022-11-16 ENCOUNTER — Emergency Department: Payer: PPO

## 2022-11-16 DIAGNOSIS — Z87891 Personal history of nicotine dependence: Secondary | ICD-10-CM | POA: Insufficient documentation

## 2022-11-16 DIAGNOSIS — I959 Hypotension, unspecified: Secondary | ICD-10-CM | POA: Diagnosis not present

## 2022-11-16 DIAGNOSIS — R7303 Prediabetes: Secondary | ICD-10-CM | POA: Insufficient documentation

## 2022-11-16 DIAGNOSIS — M25511 Pain in right shoulder: Secondary | ICD-10-CM | POA: Diagnosis not present

## 2022-11-16 DIAGNOSIS — W19XXXA Unspecified fall, initial encounter: Secondary | ICD-10-CM | POA: Diagnosis not present

## 2022-11-16 DIAGNOSIS — Z7982 Long term (current) use of aspirin: Secondary | ICD-10-CM | POA: Diagnosis not present

## 2022-11-16 DIAGNOSIS — I4729 Other ventricular tachycardia: Secondary | ICD-10-CM

## 2022-11-16 DIAGNOSIS — I10 Essential (primary) hypertension: Secondary | ICD-10-CM | POA: Diagnosis not present

## 2022-11-16 DIAGNOSIS — R0989 Other specified symptoms and signs involving the circulatory and respiratory systems: Secondary | ICD-10-CM | POA: Diagnosis not present

## 2022-11-16 DIAGNOSIS — I472 Ventricular tachycardia, unspecified: Secondary | ICD-10-CM | POA: Diagnosis not present

## 2022-11-16 DIAGNOSIS — R0902 Hypoxemia: Secondary | ICD-10-CM | POA: Diagnosis not present

## 2022-11-16 DIAGNOSIS — S0990XA Unspecified injury of head, initial encounter: Secondary | ICD-10-CM | POA: Diagnosis not present

## 2022-11-16 DIAGNOSIS — S40011A Contusion of right shoulder, initial encounter: Secondary | ICD-10-CM | POA: Diagnosis not present

## 2022-11-16 DIAGNOSIS — R42 Dizziness and giddiness: Secondary | ICD-10-CM | POA: Insufficient documentation

## 2022-11-16 DIAGNOSIS — E876 Hypokalemia: Secondary | ICD-10-CM | POA: Diagnosis not present

## 2022-11-16 DIAGNOSIS — W11XXXA Fall on and from ladder, initial encounter: Secondary | ICD-10-CM | POA: Diagnosis not present

## 2022-11-16 DIAGNOSIS — R001 Bradycardia, unspecified: Secondary | ICD-10-CM | POA: Diagnosis not present

## 2022-11-16 LAB — MAGNESIUM: Magnesium: 2 mg/dL (ref 1.7–2.4)

## 2022-11-16 LAB — HEMOGLOBIN A1C
Hgb A1c MFr Bld: 5.7 % — ABNORMAL HIGH (ref 4.8–5.6)
Mean Plasma Glucose: 116.89 mg/dL

## 2022-11-16 LAB — CBC
HCT: 39.5 % (ref 39.0–52.0)
Hemoglobin: 13.3 g/dL (ref 13.0–17.0)
MCH: 30 pg (ref 26.0–34.0)
MCHC: 33.7 g/dL (ref 30.0–36.0)
MCV: 89.2 fL (ref 80.0–100.0)
Platelets: 208 10*3/uL (ref 150–400)
RBC: 4.43 MIL/uL (ref 4.22–5.81)
RDW: 12.9 % (ref 11.5–15.5)
WBC: 7 10*3/uL (ref 4.0–10.5)
nRBC: 0 % (ref 0.0–0.2)

## 2022-11-16 LAB — TROPONIN I (HIGH SENSITIVITY): Troponin I (High Sensitivity): 14 ng/L (ref <18)

## 2022-11-16 LAB — BASIC METABOLIC PANEL
Anion gap: 10 (ref 5–15)
BUN: 22 mg/dL (ref 8–23)
CO2: 25 mmol/L (ref 22–32)
Calcium: 9.7 mg/dL (ref 8.9–10.3)
Chloride: 104 mmol/L (ref 98–111)
Creatinine, Ser: 0.88 mg/dL (ref 0.61–1.24)
GFR, Estimated: >60 mL/min (ref >60)
Glucose, Bld: 136 mg/dL — ABNORMAL HIGH (ref 70–99)
Potassium: 3.8 mmol/L (ref 3.5–5.1)
Sodium: 139 mmol/L (ref 135–145)

## 2022-11-16 LAB — TSH: TSH: 0.524 u[IU]/mL (ref 0.350–4.500)

## 2022-11-16 MED ORDER — MEMANTINE HCL 5 MG PO TABS
5.0000 mg | ORAL_TABLET | Freq: Two times a day (BID) | ORAL | Status: DC
  Administered 2022-11-16 – 2022-11-17 (×2): 5 mg via ORAL
  Filled 2022-11-16 (×2): qty 1

## 2022-11-16 MED ORDER — POLYETHYLENE GLYCOL 3350 17 G PO PACK: 17.0000 g | PACK | Freq: Every day | ORAL | Status: DC | PRN

## 2022-11-16 MED ORDER — ACETAMINOPHEN 325 MG PO TABS: 650.0000 mg | ORAL_TABLET | Freq: Four times a day (QID) | ORAL | Status: DC | PRN

## 2022-11-16 MED ORDER — ENOXAPARIN SODIUM 40 MG/0.4ML IJ SOSY
40.0000 mg | PREFILLED_SYRINGE | INTRAMUSCULAR | Status: DC
  Administered 2022-11-16: 40 mg via SUBCUTANEOUS
  Filled 2022-11-16: qty 0.4

## 2022-11-16 MED ORDER — ACETAMINOPHEN 650 MG RE SUPP: 650.0000 mg | Freq: Four times a day (QID) | RECTAL | Status: DC | PRN

## 2022-11-16 MED ORDER — SODIUM CHLORIDE 0.9% FLUSH
3.0000 mL | Freq: Two times a day (BID) | INTRAVENOUS | Status: DC
  Administered 2022-11-16 – 2022-11-17 (×2): 3 mL via INTRAVENOUS

## 2022-11-16 MED ORDER — ONDANSETRON HCL 4 MG/2ML IJ SOLN: 4.0000 mg | Freq: Four times a day (QID) | INTRAMUSCULAR | Status: DC | PRN

## 2022-11-16 MED ORDER — ASPIRIN 81 MG PO TBEC
81.0000 mg | DELAYED_RELEASE_TABLET | Freq: Every day | ORAL | Status: DC
  Administered 2022-11-17: 81 mg via ORAL
  Filled 2022-11-16: qty 1

## 2022-11-16 MED ORDER — ONDANSETRON HCL 4 MG PO TABS: 4.0000 mg | ORAL_TABLET | Freq: Four times a day (QID) | ORAL | Status: DC | PRN

## 2022-11-16 MED ORDER — TAMSULOSIN HCL 0.4 MG PO CAPS
0.4000 mg | ORAL_CAPSULE | Freq: Every evening | ORAL | Status: DC
  Administered 2022-11-16: 0.4 mg via ORAL
  Filled 2022-11-16: qty 1

## 2022-11-16 MED ORDER — ROSUVASTATIN CALCIUM 10 MG PO TABS: 10.0000 mg | ORAL_TABLET | ORAL | Status: DC

## 2022-11-16 NOTE — ED Triage Notes (Signed)
Arrived by [facility] from home for fall off of step stool.  C/o right shoulder pain. Also reports dizziness.  EMS vitals: 97 systolic sitting 657 systolic standing 174CBG 48HR  EMS admnistered: 350cc normal saline

## 2022-11-16 NOTE — H&P (Signed)
History and Physical    Patient: Mark Cowan:[id number] DOB: [birth year] DOA: 11/16/2022 DOS: the patient was seen and examined on 11/16/2022 PCP: Jerl Mina, MD  Patient coming from: Home  Chief Complaint:  Chief Complaint  Patient presents with   Fall   HPI: Mark Cowan is a 89 y.o. male with medical history significant of hypertension, mild cognitive impairment, hyperlipidemia, prediabetes,BPH, idiopathic peripheral neuropathy, who presents to the ED due to a fall.  Mark Cowan states he was in his usual state of health today when he was using a stepstool that is approximately 1 to 2 feet off the ground to reach something in an upper cabinet.  He notes that he lost his balance and fell down, hitting his right shoulder.  He denies any other injuries and states that his shoulder is just sore.  When his family came to check on him, they stated he was sitting on his buttocks leaning against a washer machine.  Prior to the fall, he denies any dizziness, palpitations, shortness of breath, chest pain.  He denies any loss of consciousness.   ED course: On arrival to the ED, patient was normotensive at 125/56 with heart rate of 44.  He was saturating at 94% on room air.  He was afebrile at 97.8. Initial workup notable for normal CBC, and BMP only notable for glucose of 136.  CT of the head was obtained with no acute intracranial abnormalities.  Chest x-ray and right shoulder x-ray no acute findings.  EKG with sinus rhythm and rate of 46.  Due to 7 beat run of V. tach seen on telemetry, cardiology consulted and [facility] contacted for admission.  Review of Systems: As mentioned in the history of present illness. All other systems reviewed and are negative.  Past Medical History:  Diagnosis Date   Arthritis    Diverticulosis    last attack 04/2011   Elevated lipids    Hypertension    dr  Rosanne Ashing   [place]    [place]   Neuropathy    Peripheral neuropathy    Prostate enlargement    Past  Surgical History:  Procedure Laterality Date   CARDIAC CATHETERIZATION     87     stress test   63   COLON SURGERY     part of colon removed,diseased   HERNIA REPAIR     KNEE ARTHROPLASTY Left 11/12/2016   Procedure: COMPUTER ASSISTED TOTAL KNEE ARTHROPLASTY;  Surgeon: Donato Heinz, MD;  Location: [facility] ORS;  Service: Orthopedics;  Laterality: Left;   LUMBAR LAMINECTOMY/DECOMPRESSION MICRODISCECTOMY  07/26/2011   Procedure: LUMBAR LAMINECTOMY/DECOMPRESSION MICRODISCECTOMY 2 LEVELS;  Surgeon: Cristi Loron, MD;  Location: [facility];  Service: Neurosurgery;  Laterality: Bilateral;  Lumbar two to four Laminectomies    MELANOMA EXCISION     lt arm   orthrocsopy  lt knee     TONSILLECTOMY     Social History:  reports that he quit smoking about 24 years ago. He has never used smokeless tobacco. He reports current alcohol use of about 7.0 standard drinks of alcohol per week. He reports that he does not use drugs.  Allergies  Allergen Reactions   Statins Other (See Comments)    Muscle pain     Family History  Problem Relation Age of Onset   Diverticulosis Father        Multiple myloma    Prior to Admission medications   Medication Sig Start Date End Date Taking? Authorizing Provider  amLODipine (NORVASC) 2.5 MG tablet Take 2.5 mg by mouth daily.    [provider]  amoxicillin (AMOXIL) 500 MG capsule  02/19/18   [provider]  aspirin EC 81 MG tablet Take 81 mg by mouth daily.    [provider]  Cholecalciferol (VITAMIN D3) 2000 units TABS Take 2,000 Units by mouth daily.    [provider]  Coenzyme Q10 (CO Q 10) 100 MG CAPS Take 100 mg by mouth daily.    [provider]  Cyanocobalamin (VITAMIN B-12) 2500 MCG SUBL Take 2,500 mcg by mouth daily.    [provider]  docusate sodium (COLACE) 100 MG capsule Take 100 mg by mouth every evening.    [provider]  dutasteride (AVODART) 0.5 MG capsule Take 0.5 mg by  mouth daily.    [provider]  fenofibrate 160 MG tablet Take 160 mg by mouth every evening.  10/02/12   [provider]  fish oil-omega-3 fatty acids 1000 MG capsule Take 1 g by mouth 2 (two) times daily.    [provider]  gabapentin (NEURONTIN) 300 MG capsule Take 300 mg by mouth every evening.     [provider]  Glucosamine-Chondroit-Vit C-Mn (GLUCOSAMINE 1500 COMPLEX PO) Take 1,500 mg by mouth 2 (two) times daily.    [provider]  losartan-hydrochlorothiazide (HYZAAR) 100-25 MG tablet Take 1 tablet by mouth daily. 09/04/16   [provider]  Lysine 500 MG CAPS Take 500 mg by mouth daily.    [provider]  Multiple Vitamins-Minerals (CENTRUM SILVER 50+MEN PO) Take by mouth.    [provider]  rosuvastatin (CRESTOR) 10 MG tablet Take 10 mg by mouth 2 (two) times a week. Tuesday and Friday    [provider]  Tamsulosin HCl (FLOMAX) 0.4 MG CAPS Take 0.4 mg by mouth every evening.     [provider]  vitamin C (ASCORBIC ACID) 500 MG tablet Take 500 mg by mouth daily.    [provider]    Physical Exam: Vitals:   11/16/22 1630 11/16/22 1700 11/16/22 1730 11/16/22 1731  BP: 135/87  (!) 128/57   Pulse: (!) 57 76 71   Resp:  17 17   Temp:    98 F (36.7 C)  TempSrc:    Oral  SpO2: 96% 95% 94%   Weight:      Height:       Physical Exam Vitals and nursing note reviewed.  Constitutional:      General: He is not in acute distress.    Appearance: He is normal weight. He is not toxic-appearing.  HENT:     Head: Normocephalic and atraumatic.     Mouth/Throat:     Mouth: Mucous membranes are moist.     Pharynx: Oropharynx is clear.  Eyes:     Conjunctiva/sclera: Conjunctivae normal.     Pupils: Pupils are equal, round, and reactive to light.  Cardiovascular:     Rate and Rhythm: Normal rate and regular rhythm.     Heart sounds: No murmur heard.    No gallop.  Pulmonary:      Effort: Pulmonary effort is normal. No respiratory distress.     Breath sounds: Normal breath sounds. No wheezing, rhonchi or rales.  Musculoskeletal:     Right lower leg: No edema.     Left lower leg: No edema.  Skin:    General: Skin is warm and dry.  Neurological:     Mental  Status: He is alert.     Comments:  Alert and oriented x 4 No focal deficit  Psychiatric:        Mood and Affect: Mood normal.        Behavior: Behavior normal.    Data Reviewed: CBC with WBC 7.0, hemoglobin 13.3, MCV of 89 and platelets of 208 BMP with sodium of 139, potassium 3.8, bicarb 25, glucose 136, BUN 22, creatinine 0.88 and GFR above 60 TSH within normal limits Magnesium 2.0  EKG personally reviewed.  Sinus rhythm with rate of 46.  First-degree AV block.  QTc within normal limits.  Nonspecific T wave flattening.  Compared to EKG obtained in 2018, T waves are more flat today, however essentially no other differences.  Similar heart rate or rhythm at the time.  DG Chest Portable 1 View  Result Date: 11/16/2022 CLINICAL DATA:  Right shoulder pain. EXAM: PORTABLE CHEST 1 VIEW COMPARISON:  07/23/2011 FINDINGS: AP portable view of the chest demonstrates slightly decreased lung volumes. No focal airspace disease or overt pulmonary edema. Heart size is within normal limits. Negative for a pneumothorax. No acute bone abnormality. IMPRESSION: Low lung volumes without acute cardiopulmonary disease. Electronically Signed   By: Richarda Overlie M.D.   On: 11/16/2022 14:58   DG Shoulder Right  Result Date: 11/16/2022 CLINICAL DATA:  Right shoulder pain.  Evaluate for fracture. EXAM: RIGHT SHOULDER - 2+ VIEW COMPARISON:  None Available. FINDINGS: Right shoulder is located without a fracture. Normal alignment at the right [facility] joint. Visualized right ribs are intact. IMPRESSION: Negative right shoulder radiographs. Electronically Signed   By: Richarda Overlie M.D.   On: 11/16/2022 14:56   CT HEAD WO CONTRAST  Result Date:  11/16/2022 CLINICAL DATA:  Trauma, fall EXAM: CT HEAD WITHOUT CONTRAST TECHNIQUE: Contiguous axial images were obtained from the base of the skull through the vertex without intravenous contrast. RADIATION DOSE REDUCTION: This exam was performed according to the departmental dose-optimization program which includes automated exposure control, adjustment of the mA and/or kV according to patient size and/or use of iterative reconstruction technique. COMPARISON:  MR brain done on 10/01/2022 FINDINGS: Brain: No acute intracranial findings are seen. There are no signs of bleeding within the cranium. Cortical sulci are prominent. Vascular: Scattered arterial calcifications are seen. Skull: No fracture is seen in the calvarium. Sinuses/Orbits: There is mild mucosal thickening in ethmoid sinus. There is deviation of nasal septum to the right. Other: None. IMPRESSION: No acute intracranial findings are seen in noncontrast CT brain. Atrophy. Mild chronic sinusitis. Electronically Signed   By: Ernie Avena M.D.   On: 11/16/2022 14:36    Results are pending, will review when available.  Assessment and Plan:  * Fall from ladder Patient is presenting after a fall from a ladder that was approximately 1 to 2 feet high.  He denies any head trauma or loss of consciousness.  No acute injuries noted on right shoulder X-ray or CT of the head.  He is not on any blood thinners.  Denies any previous falls over the last 6 months to 1 year.  - Tylenol as needed for shoulder pain  Non-sustained ventricular tachycardia (HCC) While being worked up in the ED for fall, telemetry noted a 7 beat of nonsustained V. tach.  During my conversation and examination, telemetry notable for singular PVC every 2 to 3 minutes.  Overall, patient is asymptomatic.  Given no previous cardiac history, low suspicion patient's fall was caused by V. tach.  TSH and  electrolytes within normal limits.  - Cardiology consulted by EDP; appreciate their  recommendations - Echocardiogram ordered - Telemetry monitoring  Pre-diabetes Last A1c of 6.1% approximately 3 months ago.  Will recheck today.  No indication for SSI  - A1c pending  Essential hypertension Per chart review, patient has a history of hypertension, however it is unclear if he is taking HCTZ still.  Per pharmacy review, it has not been refilled recently.  - Hold off on any antihypertensive therapy given advanced age blood pressure within goal at this time.  Advance Care Planning:   Code Status: Full Code verified by patient  Consults: Cardiology  Family Communication: Patient's wife and son updated at bedside  Severity of Illness: The appropriate patient status for this patient is OBSERVATION. Observation status is judged to be reasonable and necessary in order to provide the required intensity of service to ensure the patient's safety. The patient's presenting symptoms, physical exam findings, and initial radiographic and laboratory data in the context of their medical condition is felt to place them at decreased risk for further clinical deterioration. Furthermore, it is anticipated that the patient will be medically stable for discharge from the hospital within 2 midnights of admission.   Author: Verdene Lennert, MD 11/16/2022 5:56 PM  For on call review [email or web address].

## 2022-11-16 NOTE — ED Provider Notes (Signed)
[facility] Provider Note    Event Date/Time   First MD Initiated Contact with Patient 11/16/22 1512     (approximate)   History   Fall   HPI  Mark Cowan is a 89 y.o. male history of hypertension neuropathy diverticulosis   Today patient was on a ladder, about 3 rungs up when suddenly he just sort of lost balance and fell off.  Reports he is still having pain over his right shoulder.  Did not lose consciousness.  No chest pain no trouble breathing.  Ports it feels moderately achy.  Felt just a little dizzy  Physical Exam   Triage Vital Signs: ED Triage Vitals  Encounter Vitals Group     BP 11/16/22 1337 (!) 125/56     Systolic BP Percentile --      Diastolic BP Percentile --      Pulse Rate 11/16/22 1337 (!) 44     Resp 11/16/22 1337 16     Temp 11/16/22 1337 97.8 F (36.6 C)     Temp Source 11/16/22 1337 Oral     SpO2 11/16/22 1337 94 %     Weight 11/16/22 1341 180 lb (81.6 kg)     Height 11/16/22 1341 5' 10.5" (1.791 m)     Head Circumference --      Peak Flow --      Pain Score 11/16/22 1340 3     Pain Loc --      Pain Education --      Exclude from Growth Chart --     Most recent vital signs: Vitals:   11/16/22 1730 11/16/22 1731  BP: (!) 128/57   Pulse: 71   Resp: 17   Temp:  98 F (36.7 C)  SpO2: 94%      General: Awake, no distress.  Normocephalic atraumatic no cervical tenderness full range of motion of neck without pain or discomfort CV:  Good peripheral perfusion.  Normal tones and rate Resp:  Normal effort.  Clear bilateral Abd:  No distention.  Other:  Good range of motion of the shoulder, reports slight achiness over the right anterior shoulder no bruising bleeding  No bruising contusion or tenderness throughout exam on the abdomen or posterior back  ED Results / Procedures / Treatments   Labs (all labs ordered are listed, but only abnormal results are displayed) Labs Reviewed  BASIC METABOLIC PANEL -  Abnormal; Notable for the following components:      Result Value   Glucose, Bld 136 (*)    All other components within normal limits  CBC  TSH  MAGNESIUM  HEMOGLOBIN A1C  BASIC METABOLIC PANEL  MAGNESIUM  TROPONIN I (HIGH SENSITIVITY)     EKG  Inter by me at 1350 heart rate 45 QRS 100 QTc 430 First-degree AV block.  No evidence of acute ischemia  I reviewed patient's telemetry tracing, he had a brief nonsustained episode of multiple PVCs/V. tach approximately 7 beats     RADIOLOGY  DG Chest Portable 1 View  Result Date: 11/16/2022 CLINICAL DATA:  Right shoulder pain. EXAM: PORTABLE CHEST 1 VIEW COMPARISON:  07/23/2011 FINDINGS: AP portable view of the chest demonstrates slightly decreased lung volumes. No focal airspace disease or overt pulmonary edema. Heart size is within normal limits. Negative for a pneumothorax. No acute bone abnormality. IMPRESSION: Low lung volumes without acute cardiopulmonary disease. Electronically Signed   By: Richarda Overlie M.D.   On: 11/16/2022 14:58   DG Shoulder  Right  Result Date: 11/16/2022 CLINICAL DATA:  Right shoulder pain.  Evaluate for fracture. EXAM: RIGHT SHOULDER - 2+ VIEW COMPARISON:  None Available. FINDINGS: Right shoulder is located without a fracture. Normal alignment at the right [facility] joint. Visualized right ribs are intact. IMPRESSION: Negative right shoulder radiographs. Electronically Signed   By: Richarda Overlie M.D.   On: 11/16/2022 14:56   CT HEAD WO CONTRAST  Result Date: 11/16/2022 CLINICAL DATA:  Trauma, fall EXAM: CT HEAD WITHOUT CONTRAST TECHNIQUE: Contiguous axial images were obtained from the base of the skull through the vertex without intravenous contrast. RADIATION DOSE REDUCTION: This exam was performed according to the departmental dose-optimization program which includes automated exposure control, adjustment of the mA and/or kV according to patient size and/or use of iterative reconstruction technique. COMPARISON:  MR brain  done on 10/01/2022 FINDINGS: Brain: No acute intracranial findings are seen. There are no signs of bleeding within the cranium. Cortical sulci are prominent. Vascular: Scattered arterial calcifications are seen. Skull: No fracture is seen in the calvarium. Sinuses/Orbits: There is mild mucosal thickening in ethmoid sinus. There is deviation of nasal septum to the right. Other: None. IMPRESSION: No acute intracranial findings are seen in noncontrast CT brain. Atrophy. Mild chronic sinusitis. Electronically Signed   By: Ernie Avena M.D.   On: 11/16/2022 14:36     X-ray right shoulder interpreted by me as negative for fracture   PROCEDURES:  Critical Care performed: No  Procedures   MEDICATIONS ORDERED IN ED: Medications  enoxaparin (LOVENOX) injection 40 mg (has no administration in time range)  sodium chloride flush (NS) 0.9 % injection 3 mL (has no administration in time range)  acetaminophen (TYLENOL) tablet 650 mg (has no administration in time range)    Or  acetaminophen (TYLENOL) suppository 650 mg (has no administration in time range)  ondansetron (ZOFRAN) tablet 4 mg (has no administration in time range)    Or  ondansetron (ZOFRAN) injection 4 mg (has no administration in time range)  polyethylene glycol (MIRALAX / GLYCOLAX) packet 17 g (has no administration in time range)     IMPRESSION / MDM / ASSESSMENT AND PLAN / ED COURSE  I reviewed the triage vital signs and the nursing notes.                              Differential diagnosis includes, but is not limited to, injuries suffered from a fall, mechanical fall, he also reports slight dizziness and during observation in the ER was noted to have a brief run of multiple PVCs/V. tach for which she was completely asymptomatic.  Discussed with cardiology, will patient will be monitored overnight and evaluated by cardiology tomorrow per Dr. Darrold Junker    Patient's presentation is most consistent with acute complicated  illness / injury requiring diagnostic workup.   The patient is on the cardiac monitor to evaluate for evidence of arrhythmia and/or significant heart rate changes.   Admission discussed accepted by Dr. Huel Cote     FINAL CLINICAL IMPRESSION(S) / ED DIAGNOSES   Final diagnoses:  Ventricular tachycardia (HCC)  Fall, initial encounter  Contusion of right shoulder, initial encounter     Rx / DC Orders   ED Discharge Orders     None        Note:  This document was prepared using Dragon voice recognition software and may include unintentional dictation errors.   Sharyn Creamer, MD 11/16/22 [phone]

## 2022-11-16 NOTE — ED Notes (Signed)
Hospitalist at the bedside 

## 2022-11-16 NOTE — ED Notes (Signed)
Pt had a significant run of Vtach on the monitor - EKG captured and given to Dr. Fanny Bien.

## 2022-11-16 NOTE — ED Notes (Signed)
Pt provided food from his S/O - call bell within reach. No other concerns at this time.

## 2022-11-16 NOTE — Assessment & Plan Note (Signed)
Per chart review, patient has a history of hypertension, however it is unclear if he is taking HCTZ still.  Per pharmacy review, it has not been refilled recently.  - Hold off on any antihypertensive therapy given advanced age blood pressure within goal at this time.

## 2022-11-16 NOTE — Assessment & Plan Note (Signed)
Last A1c of 6.1% approximately 3 months ago.  Will recheck today.  No indication for SSI  - A1c pending

## 2022-11-16 NOTE — Assessment & Plan Note (Addendum)
While being worked up in the ED for fall, telemetry noted a 7 beat of nonsustained V. tach.  During my conversation and examination, telemetry notable for singular PVC every 2 to 3 minutes.  Overall, patient is asymptomatic.  Given no previous cardiac history, low suspicion patient's fall was caused by V. tach.  TSH and electrolytes within normal limits.  - Cardiology consulted by EDP; appreciate their recommendations - Echocardiogram ordered - Telemetry monitoring

## 2022-11-16 NOTE — Assessment & Plan Note (Signed)
Patient is presenting after a fall from a ladder that was approximately 1 to 2 feet high.  He denies any head trauma or loss of consciousness.  No acute injuries noted on right shoulder X-ray or CT of the head.  He is not on any blood thinners.  Denies any previous falls over the last 6 months to 1 year.  - Tylenol as needed for shoulder pain

## 2022-11-17 ENCOUNTER — Observation Stay
Admit: 2022-11-17 | Discharge: 2022-11-17 | Disposition: A | Discharge status: Home / Self Care | Payer: PPO | Attending: Internal Medicine | Admitting: Internal Medicine

## 2022-11-17 DIAGNOSIS — M25511 Pain in right shoulder: Secondary | ICD-10-CM | POA: Diagnosis not present

## 2022-11-17 DIAGNOSIS — I10 Essential (primary) hypertension: Secondary | ICD-10-CM | POA: Diagnosis not present

## 2022-11-17 DIAGNOSIS — W11XXXA Fall on and from ladder, initial encounter: Secondary | ICD-10-CM | POA: Diagnosis not present

## 2022-11-17 DIAGNOSIS — R9431 Abnormal electrocardiogram [ECG] [EKG]: Secondary | ICD-10-CM | POA: Diagnosis not present

## 2022-11-17 DIAGNOSIS — I472 Ventricular tachycardia, unspecified: Secondary | ICD-10-CM | POA: Diagnosis not present

## 2022-11-17 LAB — T4, FREE: Free T4: 1.11 ng/dL (ref 0.61–1.12)

## 2022-11-17 MED ORDER — POTASSIUM CHLORIDE CRYS ER 20 MEQ PO TBCR
40.0000 meq | EXTENDED_RELEASE_TABLET | ORAL | Status: AC
  Administered 2022-11-17 (×2): 40 meq via ORAL
  Filled 2022-11-17 (×2): qty 2

## 2022-11-17 NOTE — Progress Notes (Signed)
  Echocardiogram 2D Echocardiogram has been performed.  Mark Cowan 11/17/2022, 10:38 AM

## 2022-11-17 NOTE — Discharge Summary (Signed)
[facility] Discharge Summary   Patient: Mark Cowan MWU:[id number]  PCP: Jerl Mina, MD  Date of admission: 11/16/2022   Date of discharge:  11/17/2022     Discharge Diagnoses:   Principal Problem:   Fall from ladder Active Problems:   Non-sustained ventricular tachycardia (HCC)   Essential hypertension   Pre-diabetes   Admitted From: Home Disposition:  Home   Recommendations for Outpatient Follow-up:  PCP: in 1 wk Cardiology in 1 week Follow up LABS/TEST: Cardiac/Holter monitor, BMP in 1-2 wks    Follow-up Information     Marcina Millard, MD. Go in 1 month(s).   Specialty: Cardiology Why: Will have our office call patient Monday for holter placement. Follow up afterwards with Dr. Darrold Junker. Contact information: [address] [facility] [place] [address] [phone]                Diet recommendation: Cardiac diet  Activity: The patient is advised to gradually reintroduce usual activities, as tolerated  Discharge Condition: stable  Code Status: Full code   History of present illness: As per the H and P dictated on admission Hospital Course:  Mark Cowan is a 89 y.o. male with medical history significant of hypertension, mild cognitive impairment, hyperlipidemia, prediabetes,BPH, idiopathic peripheral neuropathy, who presents to the ED due to a fall. Mr. Snavely states he was in his usual state of health today when he was using a stepstool that is approximately 1 to 2 feet off the ground to reach something in an upper cabinet.  He notes that he lost his balance and fell down, hitting his right shoulder.  He denies any other injuries and states that his shoulder is just sore.  When his family came to check on him, they stated he was sitting on his buttocks leaning against a washer machine.  Prior to the fall, he denies any dizziness, palpitations, shortness of breath, chest pain.  He denies any loss of  consciousness.    ED course: On arrival to the ED, patient was normotensive at 125/56 with heart rate of 44.  He was saturating at 94% on room air.  He was afebrile at 97.8. Initial workup notable for normal CBC, and BMP only notable for glucose of 136.  CT of the head was obtained with no acute intracranial abnormalities.  Chest x-ray and right shoulder x-ray no acute findings.  EKG with sinus rhythm and rate of 46.  Due to 7 beat run of V. tach seen on telemetry, cardiology consulted and [facility] contacted for admission.    Assessment and Plan:   # Fall from ladder Patient is presenting after a fall from a ladder that was approximately 1 to 2 feet high.  He denies any head trauma or loss of consciousness.  No acute injuries noted on right shoulder X-ray or CT of the head.  He is not on any blood thinners.  Denies any previous falls over the last 6 months to 1 year. Continue Tylenol as needed for shoulder pain.  Orthostatic vital signs did not show significant drop in blood pressure from lying to standing position but BP dropped from sitting to standing position, without any symptoms.  Patient was advised to monitor BP at home and follow with PCP.  # Non-sustained ventricular tachycardia While being worked up in the ED for fall, telemetry noted a 7 beat of nonsustained V. tach.  Overnight and in the morning patient had multiple episode of the nonsustained V. tach but patient  remained asymptomatic.  Cardiology was consulted, recommended no cardiac workup at this time.  TTE shows LVEF 6065%, patient was cleared by cardiology to discharge home and Holter monitor as an outpatient, no further workup.  Patient ambulated well without any symptoms.  Patient wanted to be discharged home as she was cleared by cardiologist.  # Hypokalemia, potassium repleted.  Mag within normal limits.  Will follow with PCP to repeat BMP after 1 to 2 weeks # Pre-diabetes, Last A1c of 6.1% approximately 3 months ago.  Repeat HbA1c  5.7, prediabetic range.  Continue diabetic diet. # Essential hypertension: Blood pressure remained stable.  Resumed home meds losartan-HCTZ 100-25 mg p.o. daily and amlodipine 2.5 mg p.o. daily.  Patient was advised to monitor BP at home and follow with PCP to titrate medications accordingly. # BPH, continue Flomax and dutasteride  Body mass index is 25.46 kg/m.  Nutrition Interventions:  - Patient was instructed, not to drive, operate heavy machinery, perform activities at heights, swimming or participation in water activities or provide baby sitting services while on Pain, Sleep and Anxiety Medications; until his outpatient Physician has advised to do so again.  - Also recommended to not to take more than prescribed Pain, Sleep and Anxiety Medications.  Patient was ambulatory without any assistance. On the day of the discharge the patient's vitals were stable, and no other acute medical condition were reported by patient. the patient was felt safe to be discharge at Home .  Consultants: Cardiologist Procedures: None  Discharge Exam: General: Appear in no distress, no Rash; Oral Mucosa Clear, moist. Cardiovascular: S1 and S2 Present, no Murmur, Respiratory: normal respiratory effort, Bilateral Air entry present and no Crackles, no wheezes Abdomen: Bowel Sound present, Soft and no tenderness, no hernia Extremities: no Pedal edema, no calf tenderness Neurology: alert and oriented to time, place, and person affect appropriate.  Filed Weights   11/16/22 1341  Weight: 81.6 kg   Vitals:   11/17/22 1059 11/17/22 1233  BP: 128/61 (!) 141/61  Pulse: (!) 52 (!) 53  Resp: 16 16  Temp: 97.6 F (36.4 C) 97.8 F (36.6 C)  SpO2: 97% 96%    DISCHARGE MEDICATION: Allergies as of 11/17/2022       Reactions   Statins Other (See Comments)   Muscle pain         Medication List     STOP taking these medications    amoxicillin 500 MG capsule Commonly known as: AMOXIL       TAKE  these medications    amLODipine 2.5 MG tablet Commonly known as: NORVASC Take 2.5 mg by mouth daily.   ascorbic acid 500 MG tablet Commonly known as: VITAMIN C Take 500 mg by mouth daily.   aspirin EC 81 MG tablet Take 81 mg by mouth daily.   CENTRUM SILVER 50+MEN PO Take by mouth.   Co Q 10 100 MG Caps Take 100 mg by mouth daily.   docusate sodium 100 MG capsule Commonly known as: COLACE Take 100 mg by mouth every evening.   dutasteride 0.5 MG capsule Commonly known as: AVODART Take 0.5 mg by mouth daily.   fenofibrate 160 MG tablet Take 160 mg by mouth every evening.   fish oil-omega-3 fatty acids 1000 MG capsule Take 1 g by mouth 2 (two) times daily.   gabapentin 300 MG capsule Commonly known as: NEURONTIN Take 300 mg by mouth every evening.   GLUCOSAMINE 1500 COMPLEX PO Take 1,500 mg by mouth 2 (two) times  daily.   losartan-hydrochlorothiazide 100-25 MG tablet Commonly known as: HYZAAR Take 1 tablet by mouth daily.   Lysine 500 MG Caps Take 500 mg by mouth daily.   memantine 5 MG tablet Commonly known as: NAMENDA Take 5 mg by mouth 2 (two) times daily.   rosuvastatin 10 MG tablet Commonly known as: CRESTOR Take 10 mg by mouth 2 (two) times a week. Tuesday and Friday   tamsulosin 0.4 MG Caps capsule Commonly known as: FLOMAX Take 0.4 mg by mouth every evening.   Vitamin B-12 2500 MCG Subl Take 2,500 mcg by mouth daily.   Vitamin D3 50 MCG (2000 UT) Tabs Take 2,000 Units by mouth daily.       Allergies  Allergen Reactions   Statins Other (See Comments)    Muscle pain    Discharge Instructions     Call MD for:   Complete by: As directed    Chest pain or palpitations. Syncope or blackout.   Call MD for:  difficulty breathing, headache or visual disturbances   Complete by: As directed    Call MD for:  extreme fatigue   Complete by: As directed    Call MD for:  persistant dizziness or light-headedness   Complete by: As directed    Call  MD for:  persistant nausea and vomiting   Complete by: As directed    Call MD for:  severe uncontrolled pain   Complete by: As directed    Diet - low sodium heart healthy   Complete by: As directed    Discharge instructions   Complete by: As directed    Follow-up with PCP in 1 week  Follow-up with cardiology in 1 week, patient needs Holter monitor as an outpatient.   Increase activity slowly   Complete by: As directed        The results of significant diagnostics from this hospitalization (including imaging, microbiology, ancillary and laboratory) are listed below for reference.    Significant Diagnostic Studies: ECHOCARDIOGRAM COMPLETE  Result Date: 11/17/2022    ECHOCARDIOGRAM REPORT   Patient Name:   KEIGHAN AMEZCUA Date of Exam: 11/17/2022 Medical Rec #:  [id number]        Height:       70.5 in Accession #:    [id number]       Weight:       180.0 lb Date of Birth:  [birth year]        BSA:          2.006 m Patient Age:    89 years         BP:           140/89 mmHg Patient Gender: M                HR:           55 bpm. Exam Location:  [facility] Procedure: 2D Echo Indications:     Abnormal ECG R94.31  History:         Patient has no prior history of Echocardiogram examinations.  Sonographer:     Overton Mam RDCS, FASE Referring Phys:  [id number] Verdene Lennert Diagnosing Phys: Marcina Millard MD  Sonographer Comments: Technically challenging study due to limited acoustic windows and suboptimal apical window. Image acquisition challenging due to respiratory motion and Image acquisition challenging due to patient body habitus. IMPRESSIONS  1. Left ventricular ejection fraction, by estimation, is 60 to 65%. The left ventricle has normal function. The left ventricle has no  regional wall motion abnormalities. Left ventricular diastolic parameters were normal.  2. Right ventricular systolic function is normal. The right ventricular size is normal.  3. The mitral valve is normal in structure. Trivial  mitral valve regurgitation. No evidence of mitral stenosis.  4. The aortic valve is normal in structure. Aortic valve regurgitation is not visualized. No aortic stenosis is present.  5. The inferior vena cava is normal in size with greater than 50% respiratory variability, suggesting right atrial pressure of 3 mmHg. FINDINGS  Left Ventricle: Left ventricular ejection fraction, by estimation, is 60 to 65%. The left ventricle has normal function. The left ventricle has no regional wall motion abnormalities. The left ventricular internal cavity size was normal in size. There is  no left ventricular hypertrophy. Left ventricular diastolic parameters were normal. Right Ventricle: The right ventricular size is normal. No increase in right ventricular wall thickness. Right ventricular systolic function is normal. Left Atrium: Left atrial size was normal in size. Right Atrium: Right atrial size was normal in size. Pericardium: There is no evidence of pericardial effusion. Mitral Valve: The mitral valve is normal in structure. Trivial mitral valve regurgitation. No evidence of mitral valve stenosis. Tricuspid Valve: The tricuspid valve is normal in structure. Tricuspid valve regurgitation is trivial. No evidence of tricuspid stenosis. Aortic Valve: The aortic valve is normal in structure. Aortic valve regurgitation is not visualized. No aortic stenosis is present. Aortic valve peak gradient measures 10.2 mmHg. Pulmonic Valve: The pulmonic valve was normal in structure. Pulmonic valve regurgitation is not visualized. No evidence of pulmonic stenosis. Aorta: The aortic root is normal in size and structure. Venous: The inferior vena cava is normal in size with greater than 50% respiratory variability, suggesting right atrial pressure of 3 mmHg. IAS/Shunts: No atrial level shunt detected by color flow Doppler.  LEFT VENTRICLE PLAX 2D LVIDd:         5.40 cm   Diastology LVIDs:         3.50 cm   LV e' medial:    6.85 cm/s LV PW:          1.30 cm   LV E/e' medial:  11.9 LV IVS:        1.10 cm   LV e' lateral:   8.27 cm/s LVOT diam:     2.50 cm   LV E/e' lateral: 9.9 LV SV:         153 LV SV Index:   76 LVOT Area:     4.91 cm  RIGHT VENTRICLE RV Basal diam:  3.80 cm RV S prime:     19.30 cm/s TAPSE (M-mode): 2.9 cm LEFT ATRIUM           Index        RIGHT ATRIUM           Index LA diam:      3.30 cm 1.64 cm/m   RA Area:     18.10 cm LA Vol (A4C): 45.2 ml 22.53 ml/m  RA Volume:   53.70 ml  26.76 ml/m  AORTIC VALVE                 PULMONIC VALVE AV Area (Vmax): 4.39 cm     PV Vmax:       0.98 m/s AV Vmax:        160.00 cm/s  PV Peak grad:  3.8 mmHg AV Peak Grad:   10.2 mmHg LVOT Vmax:      143.00 cm/s LVOT Vmean:  98.900 cm/s LVOT VTI:       0.312 m  AORTA Ao Root diam: 3.60 cm MITRAL VALVE MV Area (PHT): 1.88 cm    SHUNTS MV Decel Time: 404 msec    Systemic VTI:  0.31 m MV E velocity: 81.80 cm/s  Systemic Diam: 2.50 cm MV A velocity: 81.20 cm/s MV E/A ratio:  1.01 Marcina Millard MD Electronically signed by Marcina Millard MD Signature Date/Time: 11/17/2022/11:24:18 AM    Final    DG Chest Portable 1 View  Result Date: 11/16/2022 CLINICAL DATA:  Right shoulder pain. EXAM: PORTABLE CHEST 1 VIEW COMPARISON:  07/23/2011 FINDINGS: AP portable view of the chest demonstrates slightly decreased lung volumes. No focal airspace disease or overt pulmonary edema. Heart size is within normal limits. Negative for a pneumothorax. No acute bone abnormality. IMPRESSION: Low lung volumes without acute cardiopulmonary disease. Electronically Signed   By: Richarda Overlie M.D.   On: 11/16/2022 14:58   DG Shoulder Right  Result Date: 11/16/2022 CLINICAL DATA:  Right shoulder pain.  Evaluate for fracture. EXAM: RIGHT SHOULDER - 2+ VIEW COMPARISON:  None Available. FINDINGS: Right shoulder is located without a fracture. Normal alignment at the right [facility] joint. Visualized right ribs are intact. IMPRESSION: Negative right shoulder radiographs.  Electronically Signed   By: Richarda Overlie M.D.   On: 11/16/2022 14:56   CT HEAD WO CONTRAST  Result Date: 11/16/2022 CLINICAL DATA:  Trauma, fall EXAM: CT HEAD WITHOUT CONTRAST TECHNIQUE: Contiguous axial images were obtained from the base of the skull through the vertex without intravenous contrast. RADIATION DOSE REDUCTION: This exam was performed according to the departmental dose-optimization program which includes automated exposure control, adjustment of the mA and/or kV according to patient size and/or use of iterative reconstruction technique. COMPARISON:  MR brain done on 10/01/2022 FINDINGS: Brain: No acute intracranial findings are seen. There are no signs of bleeding within the cranium. Cortical sulci are prominent. Vascular: Scattered arterial calcifications are seen. Skull: No fracture is seen in the calvarium. Sinuses/Orbits: There is mild mucosal thickening in ethmoid sinus. There is deviation of nasal septum to the right. Other: None. IMPRESSION: No acute intracranial findings are seen in noncontrast CT brain. Atrophy. Mild chronic sinusitis. Electronically Signed   By: Ernie Avena M.D.   On: 11/16/2022 14:36    Microbiology: No results found for this or any previous visit (from the past 240 hour(s)).   Labs: CBC: Recent Labs  Lab 11/16/22 1343  WBC 7.0  HGB 13.3  HCT 39.5  MCV 89.2  PLT 208   Basic Metabolic Panel: Recent Labs  Lab 11/16/22 1343 11/16/22 1621 11/17/22 0631  NA 139  --  139  K 3.8  --  3.2*  CL 104  --  107  CO2 25  --  26  GLUCOSE 136*  --  100*  BUN 22  --  21  CREATININE 0.88  --  0.78  CALCIUM 9.7  --  9.5  MG  --  2.0 2.1   Liver Function Tests: No results for input(s): "AST", "ALT", "ALKPHOS", "BILITOT", "PROT", "ALBUMIN" in the last 168 hours. No results for input(s): "LIPASE", "AMYLASE" in the last 168 hours. No results for input(s): "AMMONIA" in the last 168 hours. Cardiac Enzymes: No results for input(s): "CKTOTAL", "CKMB",  "CKMBINDEX", "TROPONINI" in the last 168 hours. BNP (last 3 results) No results for input(s): "BNP" in the last 8760 hours. CBG: No results for input(s): "GLUCAP" in the last 168 hours.  Time spent:  35 minutes  Signed:  Gillis Santa  [facility] 11/17/2022 1:47 PM

## 2022-11-17 NOTE — Consult Note (Signed)
[facility] CLINIC CARDIOLOGY CONSULT NOTE       Patient ID: Mark Cowan MRN: [id number] DOB/AGE: 1933/12/15 89 y.o.  Admit date: 11/16/2022 Referring Physician Dr. Sharyn Creamer Primary Physician Dr. Jerl Mina Primary Cardiologist  Reason for Consultation NSVT  HPI: Mark Cowan is a 89 y.o. male  with a past medical history of hypertension, hyperlipidemia, prediabetes, peripheral neuropathy, BPH, mild cognitive impairment who presented to the ED on 11/16/2022 for fall.  Cardiology was consulted for further evaluation due to NSVT noted on telemetry monitor.  Patient was at usual state of health yesterday and doing some work inside of his house on a ladder when he lost his balance and fell.  Denies any dizziness or loss of consciousness at that time.  He had some shoulder pain after his fall and came to the ED to be evaluated.  Denies any history or recent problems with chest pain, shortness of breath, palpitations, dizziness, syncope.  In the ED the patient was evaluated for his fall, CT head without intracranial abnormalities, chest and shoulder x-rays without acute abnormalities.  While in the ED he was placed on telemetry and incidentally noted to have a 7 beat run of ventricular tachycardia.  The patient was asymptomatic with this, denying chest pain, palpitations, dizziness.  At the time of my evaluation he is resting comfortably in bedside chair and denies any symptoms of chest pain, shortness of breath, palpitations, dizziness.  He denies any significant cardiac history.  He did have another 9 beat run of V. tach overnight on telemetry.  Review of systems complete and found to be negative unless listed above     Past Medical History:  Diagnosis Date   Arthritis    Diverticulosis    last attack 04/2011   Elevated lipids    Hypertension    dr  Rosanne Ashing   [place]    [place]   Neuropathy    Peripheral neuropathy    Prostate enlargement     Past Surgical History:  Procedure  Laterality Date   CARDIAC CATHETERIZATION     87     stress test   74   COLON SURGERY     part of colon removed,diseased   HERNIA REPAIR     KNEE ARTHROPLASTY Left 11/12/2016   Procedure: COMPUTER ASSISTED TOTAL KNEE ARTHROPLASTY;  Surgeon: Donato Heinz, MD;  Location: [facility] ORS;  Service: Orthopedics;  Laterality: Left;   LUMBAR LAMINECTOMY/DECOMPRESSION MICRODISCECTOMY  07/26/2011   Procedure: LUMBAR LAMINECTOMY/DECOMPRESSION MICRODISCECTOMY 2 LEVELS;  Surgeon: Cristi Loron, MD;  Location: [facility] OR[facility];  Service: Neurosurgery;  Laterality: Bilateral;  Lumbar two to four Laminectomies    MELANOMA EXCISION     lt arm   orthrocsopy  lt knee     TONSILLECTOMY      Medications Prior to Admission  Medication Sig Dispense Refill Last Dose   amLODipine (NORVASC) 2.5 MG tablet Take 2.5 mg by mouth daily.      amoxicillin (AMOXIL) 500 MG capsule       aspirin EC 81 MG tablet Take 81 mg by mouth daily.      Cholecalciferol (VITAMIN D3) 2000 units TABS Take 2,000 Units by mouth daily.      Coenzyme Q10 (CO Q 10) 100 MG CAPS Take 100 mg by mouth daily.      Cyanocobalamin (VITAMIN B-12) 2500 MCG SUBL Take 2,500 mcg by mouth daily.      docusate sodium (COLACE) 100 MG capsule Take 100 mg by mouth  every evening.      dutasteride (AVODART) 0.5 MG capsule Take 0.5 mg by mouth daily.      fenofibrate 160 MG tablet Take 160 mg by mouth every evening.       fish oil-omega-3 fatty acids 1000 MG capsule Take 1 g by mouth 2 (two) times daily.      gabapentin (NEURONTIN) 300 MG capsule Take 300 mg by mouth every evening.       Glucosamine-Chondroit-Vit C-Mn (GLUCOSAMINE 1500 COMPLEX PO) Take 1,500 mg by mouth 2 (two) times daily.      losartan-hydrochlorothiazide (HYZAAR) 100-25 MG tablet Take 1 tablet by mouth daily.      Lysine 500 MG CAPS Take 500 mg by mouth daily.      memantine (NAMENDA) 5 MG tablet Take 5 mg by mouth 2 (two) times daily.      Multiple Vitamins-Minerals (CENTRUM SILVER 50+MEN  PO) Take by mouth.      rosuvastatin (CRESTOR) 10 MG tablet Take 10 mg by mouth 2 (two) times a week. Tuesday and Friday      Tamsulosin HCl (FLOMAX) 0.4 MG CAPS Take 0.4 mg by mouth every evening.       vitamin C (ASCORBIC ACID) 500 MG tablet Take 500 mg by mouth daily.      Social History   Socioeconomic History   Marital status: Married    Spouse name: Not on file   Number of children: 2   Years of education: Not on file   Highest education level: Not on file  Occupational History   Occupation: Retired    Associate Professor: [employer]  Tobacco Use   Smoking status: Former    Current packs/day: 0.00    Types: Cigarettes    Quit date: 07/23/1998    Years since quitting: 24.3   Smokeless tobacco: Never  Vaping Use   Vaping status: Never Used  Substance and Sexual Activity   Alcohol use: Yes    Alcohol/week: 7.0 standard drinks of alcohol    Types: 7 Shots of liquor per week    Comment: socially   Drug use: No   Sexual activity: Not on file  Other Topics Concern   Not on file  Social History Narrative   Not on file   Social Determinants of Health   Financial Resource Strain: Patient Declined (02/22/2022)   [facility] from [facility]   Overall Financial Resource Strain (CARDIA)    Difficulty of Paying Living Expenses: Patient declined  Food Insecurity: No Food Insecurity (11/16/2022)   Hunger Vital Sign    Worried About Running Out of Food in the Last Year: Never true    Ran Out of Food in the Last Year: Never true  Transportation Needs: No Transportation Needs (11/16/2022)   PRAPARE - Administrator, Civil Service (Medical): No    Lack of Transportation (Non-Medical): No  Physical Activity: Not on file  Stress: Not on file  Social Connections: Not on file  Intimate Partner Violence: Not At Risk (11/16/2022)   Humiliation, Afraid, Rape, and Kick questionnaire    Fear of Current or Ex-Partner: No    Emotionally Abused:  No    Physically Abused: No    Sexually Abused: No    Family History  Problem Relation Age of Onset   Diverticulosis Father        Multiple myloma     Vitals:   11/16/22 2032 11/17/22 0007 11/17/22 4098 11/17/22 1191  BP: (!) 129/55 (!) 121/48 131/66 (!) 140/89  Pulse: (!) 58 (!) 54 (!) 46 (!) 54  Resp: 20 20 20 16   Temp: 98.6 F (37 C) 98 F (36.7 C) 97.7 F (36.5 C) (!) 97.5 F (36.4 C)  TempSrc: Oral  Oral   SpO2: 96% 96% 96% 96%  Weight:      Height:        PHYSICAL EXAM General: Well appearing elderly male, well nourished, in no acute distress sitting upright in bedside chair. HEENT: Normocephalic and atraumatic. Neck: No JVD.  Lungs: Normal respiratory effort on room air. Clear bilaterally to auscultation. No wheezes, crackles, rhonchi.  Heart: HRRR. Normal S1 and S2 without gallops or murmurs.  Abdomen: Non-distended appearing.  Msk: Normal strength and tone for age. Extremities: Warm and well perfused. No clubbing, cyanosis. No edema.  Neuro: Alert and oriented X 3. Psych: Answers questions appropriately.   Labs: Basic Metabolic Panel: Recent Labs    11/16/22 1343 11/16/22 1621 11/17/22 0631  NA 139  --  139  K 3.8  --  3.2*  CL 104  --  107  CO2 25  --  26  GLUCOSE 136*  --  100*  BUN 22  --  21  CREATININE 0.88  --  0.78  CALCIUM 9.7  --  9.5  MG  --  2.0 2.1   Liver Function Tests: No results for input(s): "AST", "ALT", "ALKPHOS", "BILITOT", "PROT", "ALBUMIN" in the last 72 hours. No results for input(s): "LIPASE", "AMYLASE" in the last 72 hours. CBC: Recent Labs    11/16/22 1343  WBC 7.0  HGB 13.3  HCT 39.5  MCV 89.2  PLT 208   Cardiac Enzymes: Recent Labs    11/16/22 1701  TROPONINIHS 14   BNP: No results for input(s): "BNP" in the last 72 hours. D-Dimer: No results for input(s): "DDIMER" in the last 72 hours. Hemoglobin A1C: Recent Labs    11/16/22 1752  HGBA1C 5.7*   Fasting Lipid Panel: No results for input(s):  "CHOL", "HDL", "LDLCALC", "TRIG", "CHOLHDL", "LDLDIRECT" in the last 72 hours. Thyroid Function Tests: Recent Labs    11/16/22 1621  TSH 0.524   Anemia Panel: No results for input(s): "VITAMINB12", "FOLATE", "FERRITIN", "TIBC", "IRON", "RETICCTPCT" in the last 72 hours.   Radiology: DG Chest Portable 1 View  Result Date: 11/16/2022 CLINICAL DATA:  Right shoulder pain. EXAM: PORTABLE CHEST 1 VIEW COMPARISON:  07/23/2011 FINDINGS: AP portable view of the chest demonstrates slightly decreased lung volumes. No focal airspace disease or overt pulmonary edema. Heart size is within normal limits. Negative for a pneumothorax. No acute bone abnormality. IMPRESSION: Low lung volumes without acute cardiopulmonary disease. Electronically Signed   By: Richarda Overlie M.D.   On: 11/16/2022 14:58   DG Shoulder Right  Result Date: 11/16/2022 CLINICAL DATA:  Right shoulder pain.  Evaluate for fracture. EXAM: RIGHT SHOULDER - 2+ VIEW COMPARISON:  None Available. FINDINGS: Right shoulder is located without a fracture. Normal alignment at the right [facility] joint. Visualized right ribs are intact. IMPRESSION: Negative right shoulder radiographs. Electronically Signed   By: Richarda Overlie M.D.   On: 11/16/2022 14:56   CT HEAD WO CONTRAST  Result Date: 11/16/2022 CLINICAL DATA:  Trauma, fall EXAM: CT HEAD WITHOUT CONTRAST TECHNIQUE: Contiguous axial images were obtained from the base of the skull through the vertex without intravenous contrast. RADIATION DOSE REDUCTION: This exam was performed according to the departmental dose-optimization program which includes automated exposure control, adjustment of  the mA and/or kV according to patient size and/or use of iterative reconstruction technique. COMPARISON:  MR brain done on 10/01/2022 FINDINGS: Brain: No acute intracranial findings are seen. There are no signs of bleeding within the cranium. Cortical sulci are prominent. Vascular: Scattered arterial calcifications are seen. Skull: No  fracture is seen in the calvarium. Sinuses/Orbits: There is mild mucosal thickening in ethmoid sinus. There is deviation of nasal septum to the right. Other: None. IMPRESSION: No acute intracranial findings are seen in noncontrast CT brain. Atrophy. Mild chronic sinusitis. Electronically Signed   By: Ernie Avena M.D.   On: 11/16/2022 14:36    ECHO pending  TELEMETRY reviewed by me [facility]) 11/17/2022 : sinus bradycardia rate 50s, occasional PVCs. 9 beat run of NSVT @ 0200  EKG reviewed by me: sinus bradycardia rate 46 bpm, 1st degree AVB  Data reviewed by me [facility]) 11/17/2022: last 24h vitals tele labs imaging I/O ED provider note, admission H&P  Principal Problem:   Fall from ladder Active Problems:   Essential hypertension   Non-sustained ventricular tachycardia (HCC)   Pre-diabetes    ASSESSMENT AND PLAN:  Mark Cowan is a 89 y.o. male  with a past medical history of hypertension, hyperlipidemia, prediabetes, peripheral neuropathy, BPH, mild cognitive impairment who presented to the ED on 11/16/2022 for fall.  Cardiology was consulted for further evaluation due to NSVT noted on telemetry monitor.  # NSVT Patient incidentally noted to have 7 beat and 9 beat runs of NSVT on telemetry since admission. He denies any problems with chest pain, shortness of breath, palpitations, dizziness, syncope. Electrolytes in ED WNL, potassium this AM low at 3.2. -Supplement potassium this AM.  -Echo pending read -Will have our office contact patient Monday morning for holter monitor placement.   # Fall Patient with mechanical fall in his house yesterday. No injuries sustained. Denies loss of consciousness, dizziness.  -Management per primary.  # Hypertension Patient previously on hydrochlorothiazide but this was discontinued at some point.  -Defer any antihypertensives at this time, follow up about need for medication management at outpatient appointment.  Pending review of echocardiogram  patient is stable for discharge from cardiac perspective. Will plan for holter placement Monday and follow up in our clinic.   This patient's plan of care was discussed and created with Dr. Darrold Junker and he is in agreement.  Signed: Cheryl Flash, PA-C 11/17/2022, 10:22 AM [facility] Cardiology

## 2022-11-19 DIAGNOSIS — I479 Paroxysmal tachycardia, unspecified: Secondary | ICD-10-CM | POA: Diagnosis not present

## 2022-12-13 DIAGNOSIS — R000 Tachycardia, unspecified: Secondary | ICD-10-CM | POA: Diagnosis not present

## 2022-12-24 DIAGNOSIS — E782 Mixed hyperlipidemia: Secondary | ICD-10-CM | POA: Diagnosis not present

## 2022-12-24 DIAGNOSIS — I10 Essential (primary) hypertension: Secondary | ICD-10-CM | POA: Diagnosis not present

## 2023-01-02 DIAGNOSIS — Z9849 Cataract extraction status, unspecified eye: Secondary | ICD-10-CM | POA: Diagnosis not present

## 2023-01-02 DIAGNOSIS — Z85820 Personal history of malignant melanoma of skin: Secondary | ICD-10-CM | POA: Diagnosis not present

## 2023-01-02 DIAGNOSIS — K5790 Diverticulosis of intestine, part unspecified, without perforation or abscess without bleeding: Secondary | ICD-10-CM | POA: Diagnosis not present

## 2023-01-02 DIAGNOSIS — I10 Essential (primary) hypertension: Secondary | ICD-10-CM | POA: Diagnosis not present

## 2023-01-02 DIAGNOSIS — H9190 Unspecified hearing loss, unspecified ear: Secondary | ICD-10-CM | POA: Diagnosis not present

## 2023-01-02 DIAGNOSIS — N400 Enlarged prostate without lower urinary tract symptoms: Secondary | ICD-10-CM | POA: Diagnosis not present

## 2023-01-02 DIAGNOSIS — F0390 Unspecified dementia without behavioral disturbance: Secondary | ICD-10-CM | POA: Diagnosis not present

## 2023-01-02 DIAGNOSIS — M1990 Unspecified osteoarthritis, unspecified site: Secondary | ICD-10-CM | POA: Diagnosis not present

## 2023-01-02 DIAGNOSIS — E785 Hyperlipidemia, unspecified: Secondary | ICD-10-CM | POA: Diagnosis not present

## 2023-01-02 DIAGNOSIS — Z87891 Personal history of nicotine dependence: Secondary | ICD-10-CM | POA: Diagnosis not present

## 2023-01-17 DIAGNOSIS — Z85828 Personal history of other malignant neoplasm of skin: Secondary | ICD-10-CM | POA: Diagnosis not present

## 2023-01-17 DIAGNOSIS — D1722 Benign lipomatous neoplasm of skin and subcutaneous tissue of left arm: Secondary | ICD-10-CM | POA: Diagnosis not present

## 2023-01-17 DIAGNOSIS — L570 Actinic keratosis: Secondary | ICD-10-CM | POA: Diagnosis not present

## 2023-01-17 DIAGNOSIS — L821 Other seborrheic keratosis: Secondary | ICD-10-CM | POA: Diagnosis not present

## 2023-01-17 DIAGNOSIS — L814 Other melanin hyperpigmentation: Secondary | ICD-10-CM | POA: Diagnosis not present

## 2023-01-17 DIAGNOSIS — D225 Melanocytic nevi of trunk: Secondary | ICD-10-CM | POA: Diagnosis not present

## 2023-01-17 DIAGNOSIS — L918 Other hypertrophic disorders of the skin: Secondary | ICD-10-CM | POA: Diagnosis not present

## 2023-01-17 DIAGNOSIS — D2262 Melanocytic nevi of left upper limb, including shoulder: Secondary | ICD-10-CM | POA: Diagnosis not present

## 2023-01-17 DIAGNOSIS — D485 Neoplasm of uncertain behavior of skin: Secondary | ICD-10-CM | POA: Diagnosis not present

## 2023-01-17 DIAGNOSIS — C490 Malignant neoplasm of connective and soft tissue of head, face and neck: Secondary | ICD-10-CM | POA: Diagnosis not present

## 2023-01-17 DIAGNOSIS — Z85820 Personal history of malignant melanoma of skin: Secondary | ICD-10-CM | POA: Diagnosis not present

## 2023-01-17 DIAGNOSIS — D692 Other nonthrombocytopenic purpura: Secondary | ICD-10-CM | POA: Diagnosis not present

## 2023-01-22 ENCOUNTER — Other Ambulatory Visit: Payer: Self-pay | Admitting: Student

## 2023-01-22 DIAGNOSIS — G3184 Mild cognitive impairment, so stated: Secondary | ICD-10-CM | POA: Diagnosis not present

## 2023-01-22 DIAGNOSIS — R42 Dizziness and giddiness: Secondary | ICD-10-CM

## 2023-01-22 DIAGNOSIS — R001 Bradycardia, unspecified: Secondary | ICD-10-CM | POA: Diagnosis not present

## 2023-01-22 DIAGNOSIS — W19XXXD Unspecified fall, subsequent encounter: Secondary | ICD-10-CM | POA: Diagnosis not present

## 2023-01-24 ENCOUNTER — Ambulatory Visit: Payer: PPO

## 2023-01-29 ENCOUNTER — Ambulatory Visit
Admission: RE | Admit: 2023-01-29 | Discharge: 2023-01-29 | Disposition: A | Discharge status: Home / Self Care | Payer: PPO | Source: Ambulatory Visit | Attending: Student | Admitting: Student

## 2023-01-29 ENCOUNTER — Ambulatory Visit: Payer: PPO | Attending: Student

## 2023-01-29 DIAGNOSIS — R41841 Cognitive communication deficit: Secondary | ICD-10-CM | POA: Diagnosis not present

## 2023-01-29 DIAGNOSIS — G3184 Mild cognitive impairment, so stated: Secondary | ICD-10-CM | POA: Insufficient documentation

## 2023-01-29 DIAGNOSIS — R42 Dizziness and giddiness: Secondary | ICD-10-CM | POA: Insufficient documentation

## 2023-01-29 DIAGNOSIS — I6523 Occlusion and stenosis of bilateral carotid arteries: Secondary | ICD-10-CM | POA: Diagnosis not present

## 2023-01-29 NOTE — Therapy (Signed)
OUTPATIENT SPEECH LANGUAGE PATHOLOGY  EVALUATION   Patient Name: Mark Cowan MRN: [id number] DOB:04/19/1933, 89 y.o., male Today's Date: 01/29/2023  PCP: Jerl Mina, MD REFERRING PROVIDER: Janice Coffin, PA-C   End of Session - 01/29/23 1250     Visit Number 1    Number of Visits 25    Date for SLP Re-Evaluation 04/23/23    Authorization Type [employer] P[employer]    Progress Note Due on Visit 10    SLP Start Time 1153    SLP Stop Time  1253    SLP Time Calculation (min) 60 min              Patient Active Problem List   Diagnosis Date Noted   Essential hypertension 11/16/2022   Non-sustained ventricular tachycardia (HCC) 11/16/2022   Fall from ladder 11/16/2022   Pre-diabetes 11/16/2022   S/P total knee arthroplasty 11/12/2016   Diverticulitis of colon (without mention of hemorrhage)(562.11) 11/04/2012   Screening for colon cancer 11/04/2012    ONSET DATE: 01/22/23 (referral date); cognitive decline since 2022  REFERRING DIAG: mild cognitive impairment  THERAPY DIAG:  Cognitive communication deficit  Rationale for Evaluation and Treatment Rehabilitation  SUBJECTIVE:   SUBJECTIVE STATEMENT: Pt alert, pleasant, and cooperative. "I'm not sure why I'm here."  Pt accompanied by: self and daughter, [name]  PERTINENT HISTORY: Pt is an 89 y.o. male with hx of MCI in setting of mild mixed dementia. Pt with PMHx significant of hypertension, hyperlipidemia, prediabetes, BPH, and idiopathic peripheral neuropathy.   DIAGNOSTIC FINDINGS: MRI brain, 10/01/2022 ". No acute intracranial abnormality.  2. Generalized volume loss and findings of chronic small vessel ischemia."  Per Neurology note, "APOE genetic testing shows E3/E4 variant which is associated with a slight increase risk of development of late-onset Alzheimer's Disease compared to the general population. This lab does not diagnose of Alzheimer's Disease You had ATN panel done. A - Beta Amyloid 42/40  ratio, T- Phosphorylated- Tau 181 N- Plasma Neurofilament Light Chain Protein Reduced Beta Amyloid 42/40 ratio, elevated p-Tau-181 has been associated with presence of Alzheimer's Disease pathology."  SLUMS 10/01/22, 15/30.   PAIN:  Are you having pain? No   FALLS: Has patient fallen in last 6 months?  Yes, including fall off of a step stool  LIVING ENVIRONMENT: Lives with: lives with their spouse Lives in: House/apartment; Independent living; moved there "a few weeks ago"  PLOF:  Level of assistance: Independent with ADLs, Needed assistance with IADLS Employment: Retired   PATIENT GOALS   to improve his "thinking"  OBJECTIVE:   COGNITIVE COMMUNICATION: Overall cognitive status: Impaired Areas of impairment:  Oriented to person Oriented to place Attention: Impaired: Selective Memory: Impaired: Immediate Short term Awareness: Impaired: Intellectual Executive function: Impaired: Error awareness and Self-correction Impaired Auditory comprehension: WFL Verbal expression: Impaired: see below   AUDITORY COMPREHENSION: Overall auditory comprehension: Appears intact with occasional repetition of stimuli YES/NO questions: Appears intact Following directions: Appears intact Conversation: Simple Interfering components: hearing Effective technique: extra processing time and repetition/stressing words   READING COMPREHENSION:   DNT  EXPRESSION: verbal  VERBAL EXPRESSION: Level of generative/spontaneous verbalization: sentence and conversation Automatic speech: name: intact and social response: intact  Repetition: Appears intact Naming: Confrontation: reduced for low frequency words and Divergent: 7 animals in 60s Pragmatics: Appears intact   WRITTEN EXPRESSION: Dominant hand: right   Written expression: Appears intact  MOTOR SPEECH: Overall motor speech: Appears intact  ORAL MOTOR EXAMINATION: No functional deficits noted  STANDARDIZED  ASSESSMENTS: Addenbrooke's  Cognitive Examination - ACE III The Addenbrooke's Cognitive Examination-III (ACE-III) is a brief cognitive test that assesses five cognitive domains. The total score is 100 with higher scores indicating better cognitive functioning. Cut off scores of 88 and 82 are recommended for suspicion of dementia (88 has sensitivity of 1.00 and specificity of 0.96, 82 has sensitivity of 0.93 and specificity of 1.00). American Version C  Attention 15/18  Memory 15/26  Fluency 9/14  Language 22/26  Visuospatial 15/16  TOTAL ACE- III Score 76/100     PATIENT REPORTED OUTCOME MEASURES (PROM):  MULTIFACTORIAL MEMORY QUESTIONNAIRE (MMQ)  Administered patient self-reported outcome measure Multifactorial Memory Questionnaire (MMQ). The Multifactorial Memory Questionnaire [facility]) consists of three scales measuring separate aspects of metamemory; Satisfaction, Ability and Strategy.   Pt's responses are converted to T-Scores with severity levels based on pt's T-Score.   Severity Levels (T-score) Very Low - < 20 Low - 20 to 29 Below Average - 30-39 Average - 40 to 60 Above Average - 60 to 70 High - 71 to 80 Very High - > 80  Pt reports:  Below Average - 30-39 Memory Satisfaction (T-score: 45) Average - 40 to 60 Memory Ability (T-score: 56) Low - 20 to 29 use of Memory Strategies (T-score: 40)      TODAY'S TREATMENT:  Pt and daughter educated at length re: role of SLP, results of assessment, changes to cognitive-communication, importance of physical activity and social engagement in cognitive functioning, importance of routine in promoting cognition, therapy goals, and SLP POC. Pt tasked with taking a more active role in management of day-to-day activities. Pt to bring a calendar to next appointment. Discussed f/u with audiologist as pt's hearing appeared to be affecting performance on today's tasks. Discussed importance of hearing in prevention of further cognitive decline as  well as for general safety.    PATIENT EDUCATION: Education details: as above Person educated: Patient and Child(ren) Education method: Explanation Education comprehension: verbalized understanding and needs further education  HOME EXERCISE PROGRAM:        Bring calendar to next appointment    GOALS:  Goals reviewed with patient? Yes  SHORT TERM GOALS: Target date: 10 sessions  Pt will participate in further dynamic/functional assessment of pt's ability to complete iADLs (e.g. medication management, balancing a checkbook). Baseline: Goal status: INITIAL   2.  Pt and/or family member will ID x3 strategies to improve functional attention and memory.  Baseline:  Goal status: INITIAL  3.  Patient will create daily to-do list 5/7 days.  Baseline:  Goal status: INITIAL  4.  Patient will develop memory system and remember to bring it to >75% of sessions.  Baseline:  Goal status: INITIAL  5.  Pt will demonstrate compensatory strategies for anomia with min assistance during structured tasks. Baseline:  Goal status: INITIAL  6.  Pt will report engagement in cognitive activities 2x week outside of SLP services. Baseline:  Goal status: INITIAL  LONG TERM GOALS: Target date: 12 weeks  Patient report improved use of compensations as per improvement on PROM.  Baseline:  Goal status: INITIAL  2.  Patient will demonstrate understanding of appropriate functional tasks/strategies for daily cognitive activities.  Baseline:  Goal status: INITIAL     ASSESSMENT:  CLINICAL IMPRESSION:  Pt is 89 y.o. male who presents for cognitive-communication evaluation in setting of mild mixed dementia (Alzheimer's Disease, vascular). Pt presents with cognitive-linguistic deficits affecting attention, memory, executive functioning and higher level expressive communication. Pt with reduced insight into CLOF. Hearing  likely impacting performance on today's assessment. Pt and daughter report that  pt is still driving locally, manages his finances, and manages his medication. Pt encouraged to f/u with Audiology. I recommend course of skilled ST for training in strategies to manage cognitive impairments in order to improve function and quality of life.   OBJECTIVE IMPAIRMENTS include attention, memory, awareness, executive functioning, and expressive language. These impairments are limiting patient from ADLs/IADLs and effectively communicating at home and in community. Factors affecting potential to achieve goals and functional outcome are ability to learn/carryover information, medical prognosis, and severity of impairments.. Patient will benefit from skilled SLP services to address above impairments and improve overall function.  REHAB POTENTIAL: Good  PLAN: SLP FREQUENCY: 1-2x/week  SLP DURATION: 12 weeks  PLANNED INTERVENTIONS: Environmental controls, Cueing hierachy, Cognitive reorganization, Internal/external aids, Functional tasks, SLP instruction and feedback, Compensatory strategies, and Patient/family education    Clyde Canterbury, M.S., CCC-SLP Speech-Language Pathologist [facility] [phone] Arnette Felts)  [place] [facility] [address] [place], [place], [address] Phone: [phone]   Fax:  [phone]

## 2023-02-05 ENCOUNTER — Ambulatory Visit: Payer: PPO

## 2023-02-05 DIAGNOSIS — R41841 Cognitive communication deficit: Secondary | ICD-10-CM

## 2023-02-05 DIAGNOSIS — G3184 Mild cognitive impairment, so stated: Secondary | ICD-10-CM | POA: Diagnosis not present

## 2023-02-05 NOTE — Therapy (Signed)
OUTPATIENT SPEECH LANGUAGE PATHOLOGY  TREATMENT   Patient Name: Mark Cowan MRN: [id number] DOB:02-26-1934, 89 y.o., male Today's Date: 02/05/2023  PCP: Jerl Mina, MD REFERRING PROVIDER: Janice Coffin, PA-C   End of Session - 02/05/23 1245     Visit Number 2    Number of Visits 25    Date for SLP Re-Evaluation 04/23/23    Authorization Type [employer] P[employer]    Progress Note Due on Visit 10    SLP Start Time 1145    SLP Stop Time  1230    SLP Time Calculation (min) 45 min    Activity Tolerance Patient tolerated treatment well              Patient Active Problem List   Diagnosis Date Noted   Essential hypertension 11/16/2022   Non-sustained ventricular tachycardia (HCC) 11/16/2022   Fall from ladder 11/16/2022   Pre-diabetes 11/16/2022   S/P total knee arthroplasty 11/12/2016   Diverticulitis of colon (without mention of hemorrhage)(562.11) 11/04/2012   Screening for colon cancer 11/04/2012    ONSET DATE: 01/22/23 (referral date); cognitive decline since 2022  REFERRING DIAG: mild cognitive impairment  THERAPY DIAG:  Mild cognitive impairment  Cognitive communication deficit  Rationale for Evaluation and Treatment Rehabilitation  SUBJECTIVE:   SUBJECTIVE STATEMENT: Pt alert, pleasant, and cooperative.   Pt accompanied by: self and daughter, [name]  PERTINENT HISTORY: Pt is an 89 y.o. male with hx of MCI in setting of mild mixed dementia. Pt with PMHx significant of hypertension, hyperlipidemia, prediabetes, BPH, and idiopathic peripheral neuropathy.   DIAGNOSTIC FINDINGS: MRI brain, 10/01/2022 ". No acute intracranial abnormality.  2. Generalized volume loss and findings of chronic small vessel ischemia."  Per Neurology note, "APOE genetic testing shows E3/E4 variant which is associated with a slight increase risk of development of late-onset Alzheimer's Disease compared to the general population. This lab does not diagnose of Alzheimer's  Disease You had ATN panel done. A - Beta Amyloid 42/40 ratio, T- Phosphorylated- Tau 181 N- Plasma Neurofilament Light Chain Protein Reduced Beta Amyloid 42/40 ratio, elevated p-Tau-181 has been associated with presence of Alzheimer's Disease pathology."  SLUMS 10/01/22, 15/30.   PAIN:  Are you having pain? No   FALLS: Has patient fallen in last 6 months?  Yes, including fall off of a step stool  LIVING ENVIRONMENT: Lives with: lives with their spouse Lives in: House/apartment; Independent living; moved there "a few weeks ago"  PLOF:  Level of assistance: Independent with ADLs, Needed assistance with IADLS Employment: Retired   PATIENT GOALS   to improve his "thinking"  OBJECTIVE:    TODAY'S TREATMENT:  Pt and daughter stated pt just had hearing aids "fixed" this morning. Pt reports that he is hearing much better and daughter agrees. Reviewed importance of hearing for cognitive-linguistic functioning, social participation, and safety.   Pt brought calendar with him with appointments for October filled out. Daughter reports helping him fill in calendar thus far. Pt able to utilize calendar with extra time to answer temporal orientation questions with 100% accuracy. Pt filled in appointments for Novembers as well as social engagements and holidays with min/mod A. Encouraged pt to mark of dates/items when completed and to continue to fill in calendar when additional appointment arise. Pt also told to review calendar to better anticipate next day's events.   Introduced different types of attention and attention strategies (e.g. environmental modifications, metacognitive strategies, routine, etc). Pt able to identified at least x3 strategies that he uses or  has used in the past to improve attention. Additional emphasis placed on auditory attention strategies. Suspect auditory attention may improve now that hearing aids are repaired.   PATIENT EDUCATION: Education details: as  above Person educated: Patient and Child(ren) Education method: Explanation; Handout Education comprehension: verbalized understanding and needs further education  HOME EXERCISE PROGRAM:        1) Use calendar - mark off dates, review night before  2) Review attention strategies    GOALS:  Goals reviewed with patient? Yes  SHORT TERM GOALS: Target date: 10 sessions  Pt will participate in further dynamic/functional assessment of pt's ability to complete iADLs (e.g. medication management, balancing a checkbook). Baseline: Goal status: INITIAL   2.  Pt and/or family member will ID x3 strategies to improve functional attention and memory.  Baseline:  Goal status: INITIAL  3.  Patient will create daily to-do list 5/7 days.  Baseline:  Goal status: INITIAL  4.  Patient will develop memory system and remember to bring it to >75% of sessions.  Baseline:  Goal status: INITIAL  5.  Pt will demonstrate compensatory strategies for anomia with min assistance during structured tasks. Baseline:  Goal status: INITIAL  6.  Pt will report engagement in cognitive activities 2x week outside of SLP services. Baseline:  Goal status: INITIAL  LONG TERM GOALS: Target date: 12 weeks  Patient report improved use of compensations as per improvement on PROM.  Baseline:  Goal status: INITIAL  2.  Patient will demonstrate understanding of appropriate functional tasks/strategies for daily cognitive activities.  Baseline:  Goal status: INITIAL     ASSESSMENT:  CLINICAL IMPRESSION:  Pt is 89 y.o. male who presents for cognitive-communication evaluation in setting of mild mixed dementia (Alzheimer's Disease, vascular). Pt presents with cognitive-linguistic deficits affecting attention, memory, executive functioning and higher level expressive communication. Pt with reduced insight into CLOF. Hearing likely impacting performance on initial assessment. Pt and daughter report that pt is still  driving locally, manages his finances, and manages his medication. See tx details above. I recommend course of skilled ST for training in strategies to manage cognitive impairments in order to improve function and quality of life.   OBJECTIVE IMPAIRMENTS include attention, memory, awareness, executive functioning, and expressive language. These impairments are limiting patient from ADLs/IADLs and effectively communicating at home and in community. Factors affecting potential to achieve goals and functional outcome are ability to learn/carryover information, medical prognosis, and severity of impairments.. Patient will benefit from skilled SLP services to address above impairments and improve overall function.  REHAB POTENTIAL: Good  PLAN: SLP FREQUENCY: 1-2x/week  SLP DURATION: 12 weeks  PLANNED INTERVENTIONS: Environmental controls, Cueing hierachy, Cognitive reorganization, Internal/external aids, Functional tasks, SLP instruction and feedback, Compensatory strategies, and Patient/family education    Clyde Canterbury, M.S., CCC-SLP Speech-Language Pathologist [place] [facility] [phone] Arnette Felts)  [facility] Outpatient Rehabilitation at [facility] [address] [place], [place], [address] Phone: [phone]   Fax:  [phone]

## 2023-02-07 ENCOUNTER — Ambulatory Visit: Payer: PPO

## 2023-02-07 DIAGNOSIS — R41841 Cognitive communication deficit: Secondary | ICD-10-CM

## 2023-02-07 DIAGNOSIS — G3184 Mild cognitive impairment, so stated: Secondary | ICD-10-CM

## 2023-02-07 NOTE — Therapy (Signed)
OUTPATIENT SPEECH LANGUAGE PATHOLOGY  TREATMENT   Patient Name: Mark Cowan MRN: [id number] DOB:[birth year], 89 y.o., male Today's Date: 02/07/2023  PCP: Jerl Mina, MD REFERRING PROVIDER: Janice Coffin, PA-C   End of Session - 02/07/23 1009     Visit Number 3    Number of Visits 25    Date for SLP Re-Evaluation 04/23/23    Authorization Type [employer]    Progress Note Due on Visit 10    SLP Start Time 0935    SLP Stop Time  1015    SLP Time Calculation (min) 40 min    Activity Tolerance Patient tolerated treatment well              Patient Active Problem List   Diagnosis Date Noted   Essential hypertension 11/16/2022   Non-sustained ventricular tachycardia (HCC) 11/16/2022   Fall from ladder 11/16/2022   Pre-diabetes 11/16/2022   S/P total knee arthroplasty 11/12/2016   Diverticulitis of colon (without mention of hemorrhage)(562.11) 11/04/2012   Screening for colon cancer 11/04/2012    ONSET DATE: 01/22/23 (referral date); cognitive decline since 2022  REFERRING DIAG: mild cognitive impairment  THERAPY DIAG:  Mild cognitive impairment  Cognitive communication deficit  Rationale for Evaluation and Treatment Rehabilitation  SUBJECTIVE:   SUBJECTIVE STATEMENT: Pt alert, pleasant, and cooperative.   Pt accompanied by: self  PERTINENT HISTORY: Pt is an 89 y.o. male with hx of MCI in setting of mild mixed dementia. Pt with PMHx significant of hypertension, hyperlipidemia, prediabetes, BPH, and idiopathic peripheral neuropathy.   DIAGNOSTIC FINDINGS: MRI brain, 10/01/2022 ". No acute intracranial abnormality.  2. Generalized volume loss and findings of chronic small vessel ischemia."  Per Neurology note, "APOE genetic testing shows E3/E4 variant which is associated with a slight increase risk of development of late-onset Alzheimer's Disease compared to the general population. This lab does not diagnose of Alzheimer's Disease You had ATN  panel done. A - Beta Amyloid 42/40 ratio, T- Phosphorylated- Tau 181 N- Plasma Neurofilament Light Chain Protein Reduced Beta Amyloid 42/40 ratio, elevated p-Tau-181 has been associated with presence of Alzheimer's Disease pathology."  SLUMS 10/01/22, 15/30.   PAIN:  Are you having pain? No   FALLS: Has patient fallen in last 6 months?  Yes, including fall off of a step stool  LIVING ENVIRONMENT: Lives with: lives with their spouse Lives in: House/apartment; Independent living; moved there "a few weeks ago"  PLOF:  Level of assistance: Independent with ADLs, Needed assistance with IADLS Employment: Retired   PATIENT GOALS   to improve his "thinking"  OBJECTIVE:    TODAY'S TREATMENT:  Pt stated he was able to find his way to the rehabilitation center lobby without difficulty. Wife currently at an appointment at another clinic at hospital.    Pt did not bring calendar to session and did not recall having a calendar that his daughter made for him which he brought to last session. Pt required mod/max cueing to recall upcoming social engagement ([employer] group) since he did not have his calendar present. Pt reports using shared calendar with wife.   Education provided re: different types of memory, external memory strategies, basic wordfinding strategies, and ways to improve cognition. Pt with limited insight into CLOF. Pt denying much difficulty with memory. Pt wrote "to do" list for next appointment including bringing personal calendar and [place] activity calendar to help increase pt's social engagement.   PATIENT EDUCATION: Education details: as above Person educated: Patient Education method: Programmer, multimedia; Herbalist  comprehension: verbalized understanding and needs further education  HOME EXERCISE PROGRAM:        1) Use calendar - mark off dates, review night before  2) Review attention strategies  3) Review memory strategies    GOALS:  Goals reviewed with  patient? Yes  SHORT TERM GOALS: Target date: 10 sessions  Pt will participate in further dynamic/functional assessment of pt's ability to complete iADLs (e.g. medication management, balancing a checkbook). Baseline: Goal status: INITIAL   2.  Pt and/or family member will ID x3 strategies to improve functional attention and memory.  Baseline:  Goal status: INITIAL  3.  Patient will create daily to-do list 5/7 days.  Baseline:  Goal status: INITIAL  4.  Patient will develop memory system and remember to bring it to >75% of sessions.  Baseline:  Goal status: INITIAL  5.  Pt will demonstrate compensatory strategies for anomia with min assistance during structured tasks. Baseline:  Goal status: INITIAL  6.  Pt will report engagement in cognitive activities 2x week outside of SLP services. Baseline:  Goal status: INITIAL  LONG TERM GOALS: Target date: 12 weeks  Patient report improved use of compensations as per improvement on PROM.  Baseline:  Goal status: INITIAL  2.  Patient will demonstrate understanding of appropriate functional tasks/strategies for daily cognitive activities.  Baseline:  Goal status: INITIAL     ASSESSMENT:  CLINICAL IMPRESSION:  Pt is 89 y.o. male who presents for cognitive-communication evaluation in setting of mild mixed dementia (Alzheimer's Disease, vascular). Pt presents with cognitive-linguistic deficits affecting attention, memory, executive functioning and higher level expressive communication. Pt with reduced insight into CLOF. Hearing likely impacting performance on initial assessment. Pt and daughter report that pt is still driving locally, manages his finances, and manages his medication. See tx details above. I recommend course of skilled ST for training in strategies to manage cognitive impairments in order to improve function and quality of life.   OBJECTIVE IMPAIRMENTS include attention, memory, awareness, executive functioning, and  expressive language. These impairments are limiting patient from ADLs/IADLs and effectively communicating at home and in community. Factors affecting potential to achieve goals and functional outcome are ability to learn/carryover information, medical prognosis, and severity of impairments.. Patient will benefit from skilled SLP services to address above impairments and improve overall function.  REHAB POTENTIAL: Good  PLAN: SLP FREQUENCY: 1-2x/week  SLP DURATION: 12 weeks  PLANNED INTERVENTIONS: Environmental controls, Cueing hierachy, Cognitive reorganization, Internal/external aids, Functional tasks, SLP instruction and feedback, Compensatory strategies, and Patient/family education    Clyde Canterbury, M.S., CCC-SLP Speech-Language Pathologist [place] [facility] [phone] Arnette Felts)   [facility] Outpatient Rehabilitation at [facility] [address] [place], [place], [address] Phone: [phone]   Fax:  [phone]

## 2023-02-12 ENCOUNTER — Ambulatory Visit: Payer: PPO

## 2023-02-12 DIAGNOSIS — G3184 Mild cognitive impairment, so stated: Secondary | ICD-10-CM | POA: Diagnosis not present

## 2023-02-12 DIAGNOSIS — R41841 Cognitive communication deficit: Secondary | ICD-10-CM

## 2023-02-12 NOTE — Therapy (Signed)
OUTPATIENT SPEECH LANGUAGE PATHOLOGY  TREATMENT   Patient Name: Mark Cowan MRN: [id number] DOB:[birth year], 89 y.o., male Today's Date: 02/12/2023  PCP: Jerl Mina, MD REFERRING PROVIDER: Janice Coffin, PA-C   End of Session - 02/12/23 1237     Visit Number 4    Number of Visits 25    Date for SLP Re-Evaluation 04/23/23    Authorization Type [employer] P[employer]    Progress Note Due on Visit 10    SLP Start Time 1154    SLP Stop Time  1237    SLP Time Calculation (min) 43 min    Activity Tolerance Patient tolerated treatment well              Patient Active Problem List   Diagnosis Date Noted   Essential hypertension 11/16/2022   Non-sustained ventricular tachycardia (HCC) 11/16/2022   Fall from ladder 11/16/2022   Pre-diabetes 11/16/2022   S/P total knee arthroplasty 11/12/2016   Diverticulitis of colon (without mention of hemorrhage)(562.11) 11/04/2012   Screening for colon cancer 11/04/2012    ONSET DATE: 01/22/23 (referral date); cognitive decline since 2022  REFERRING DIAG: mild cognitive impairment  THERAPY DIAG:  Mild cognitive impairment  Cognitive communication deficit  Rationale for Evaluation and Treatment Rehabilitation  SUBJECTIVE:   SUBJECTIVE STATEMENT: Pt alert, pleasant, and cooperative.   Pt accompanied by: self and family member; daughter  PERTINENT HISTORY: Pt is an 89 y.o. male with hx of MCI in setting of mild mixed dementia. Pt with PMHx significant of hypertension, hyperlipidemia, prediabetes, BPH, and idiopathic peripheral neuropathy.   DIAGNOSTIC FINDINGS: MRI brain, 10/01/2022 ". No acute intracranial abnormality.  2. Generalized volume loss and findings of chronic small vessel ischemia."  Per Neurology note, "APOE genetic testing shows E3/E4 variant which is associated with a slight increase risk of development of late-onset Alzheimer's Disease compared to the general population. This lab does not diagnose of  Alzheimer's Disease You had ATN panel done. A - Beta Amyloid 42/40 ratio, T- Phosphorylated- Tau 181 N- Plasma Neurofilament Light Chain Protein Reduced Beta Amyloid 42/40 ratio, elevated p-Tau-181 has been associated with presence of Alzheimer's Disease pathology."  SLUMS 10/01/22, 15/30.   PAIN:  Are you having pain? No   FALLS: Has patient fallen in last 6 months?  Yes, including fall off of a step stool  LIVING ENVIRONMENT: Lives with: lives with their spouse Lives in: House/apartment; Independent living; moved there "a few weeks ago"  PLOF:  Level of assistance: Independent with ADLs, Needed assistance with IADLS Employment: Retired   PATIENT GOALS   to improve his "thinking"  OBJECTIVE:    TODAY'S TREATMENT:  Pt seen for skilled SLP services targeting functional attention, memory, and problem solving.    Memory compensations: Pt brought calendar with him. Pt able to answer temporal orientation questions and questions about upcoming appointment with extra time. Noted pt writing both his and his wife's appointments on the calendar. Pt required min cueing for completeness of information for use of calendar to remind pt to bring ILF activities calendar to tx. Pt and daughter both agree that pt has been more diligent in using calendar and reviews calendar at the beginning and end of each day.   Dynamic/functional tx:  Medication management: Pt answered questions from mock Rx labels with extra time. Utilizing a pill organizer, pt able to set up x3 mock medications for x1 week. Pt set up independently; however, pt did not recall setting up a medication previously stating, "how did that  get in here?" With min A, pt able to problem solve to ID "medication" and check for correctness. Pt likely OK to set up medication at home with check behind from family.   Financial management: Pt able to solve functional math problems re: finances (bills, receipts, advertisements) with 90% accuracy.  Pt with x1 instance of losing attention / difficulty retaining sum. When asked what he would do at home if that happened, pt stated he would "start back over." Pt likely OK to continue financial management with check behind from family.  Education: pt and daughter educated re: progress to date, "homework," need for continued supervision/check behind with medication/financial management, and SLP POC. Both verbalized understanding/agreement.     PATIENT EDUCATION: Education details: as above Person educated: Patient; Daughter Education method: Explanation Education comprehension: verbalized understanding and needs further education  HOME EXERCISE PROGRAM:        1) Use calendar - mark off dates, review night before  2) Bring activity calendar from ILF    GOALS:  Goals reviewed with patient? Yes  SHORT TERM GOALS: Target date: 10 sessions  Pt will participate in further dynamic/functional assessment of pt's ability to complete iADLs (e.g. medication management, balancing a checkbook). Baseline: Goal status: MET   2.  Pt and/or family member will ID x3 strategies to improve functional attention and memory.  Baseline:  Goal status: INITIAL  3.  Patient will create daily to-do list 5/7 days.  Baseline:  Goal status: INITIAL  4.  Patient will develop memory system and remember to bring it to >75% of sessions.  Baseline:  Goal status: INITIAL  5.  Pt will demonstrate compensatory strategies for anomia with min assistance during structured tasks. Baseline:  Goal status: INITIAL  6.  Pt will report engagement in cognitive activities 2x week outside of SLP services. Baseline:  Goal status: INITIAL  LONG TERM GOALS: Target date: 12 weeks  Patient report improved use of compensations as per improvement on PROM.  Baseline:  Goal status: INITIAL  2.  Patient will demonstrate understanding of appropriate functional tasks/strategies for daily cognitive activities.  Baseline:   Goal status: INITIAL     ASSESSMENT:  CLINICAL IMPRESSION:  Pt is 89 y.o. male who presents for cognitive-communication evaluation in setting of mild mixed dementia (Alzheimer's Disease, vascular). Pt presents with cognitive-linguistic deficits affecting attention, memory, executive functioning and higher level expressive communication. Pt with reduced insight into CLOF. Hearing likely impacting performance on initial assessment. Pt and daughter report that pt is still driving locally, manages his finances, and manages his medication. See tx details above. I recommend course of skilled ST for training in strategies to manage cognitive impairments in order to improve function and quality of life.   OBJECTIVE IMPAIRMENTS include attention, memory, awareness, executive functioning, and expressive language. These impairments are limiting patient from ADLs/IADLs and effectively communicating at home and in community. Factors affecting potential to achieve goals and functional outcome are ability to learn/carryover information, medical prognosis, and severity of impairments.. Patient will benefit from skilled SLP services to address above impairments and improve overall function.  REHAB POTENTIAL: Good  PLAN: SLP FREQUENCY: 1-2x/week  SLP DURATION: 12 weeks  PLANNED INTERVENTIONS: Environmental controls, Cueing hierachy, Cognitive reorganization, Internal/external aids, Functional tasks, SLP instruction and feedback, Compensatory strategies, and Patient/family education    Clyde Canterbury, M.S., CCC-SLP Speech-Language Pathologist [place] [facility] [phone] Arnette Felts)  [place] [facility] Outpatient Rehabilitation at [facility] [address] [place],  [place], [address] Phone: [phone]   Fax:  [phone]

## 2023-02-14 ENCOUNTER — Ambulatory Visit: Payer: PPO

## 2023-02-14 DIAGNOSIS — R41841 Cognitive communication deficit: Secondary | ICD-10-CM

## 2023-02-14 DIAGNOSIS — G3184 Mild cognitive impairment, so stated: Secondary | ICD-10-CM

## 2023-02-14 NOTE — Therapy (Signed)
OUTPATIENT SPEECH LANGUAGE PATHOLOGY  TREATMENT   Patient Name: Mark Cowan MRN: [id number] DOB:1934-03-29, 89 y.o., male Today's Date: 02/14/2023  PCP: Jerl Mina, MD REFERRING PROVIDER: Janice Coffin, PA-C   End of Session - 02/14/23 1152     Visit Number 5    Number of Visits 25    Date for SLP Re-Evaluation 04/23/23    Authorization Type [employer]    Progress Note Due on Visit 10    SLP Start Time 0933    SLP Stop Time  1015    SLP Time Calculation (min) 42 min    Activity Tolerance Patient tolerated treatment well              Patient Active Problem List   Diagnosis Date Noted   Essential hypertension 11/16/2022   Non-sustained ventricular tachycardia (HCC) 11/16/2022   Fall from ladder 11/16/2022   Pre-diabetes 11/16/2022   S/P total knee arthroplasty 11/12/2016   Diverticulitis of colon (without mention of hemorrhage)(562.11) 11/04/2012   Screening for colon cancer 11/04/2012    ONSET DATE: 01/22/23 (referral date); cognitive decline since 2022  REFERRING DIAG: mild cognitive impairment  THERAPY DIAG:  Mild cognitive impairment  Cognitive communication deficit  Rationale for Evaluation and Treatment Rehabilitation  SUBJECTIVE:   SUBJECTIVE STATEMENT: Pt alert, pleasant, and cooperative.   Pt accompanied by: self and family member; daughter  PERTINENT HISTORY: Pt is an 89 y.o. male with hx of MCI in setting of mild mixed dementia. Pt with PMHx significant of hypertension, hyperlipidemia, prediabetes, BPH, and idiopathic peripheral neuropathy.   DIAGNOSTIC FINDINGS: MRI brain, 10/01/2022 ". No acute intracranial abnormality.  2. Generalized volume loss and findings of chronic small vessel ischemia."  Per Neurology note, "APOE genetic testing shows E3/E4 variant which is associated with a slight increase risk of development of late-onset Alzheimer's Disease compared to the general population. This lab does not diagnose of  Alzheimer's Disease You had ATN panel done. A - Beta Amyloid 42/40 ratio, T- Phosphorylated- Tau 181 N- Plasma Neurofilament Light Chain Protein Reduced Beta Amyloid 42/40 ratio, elevated p-Tau-181 has been associated with presence of Alzheimer's Disease pathology."  SLUMS 10/01/22, 15/30.   PAIN:  Are you having pain? No   FALLS: Has patient fallen in last 6 months?  Yes, including fall off of a step stool  LIVING ENVIRONMENT: Lives with: lives with their spouse Lives in: House/apartment; Independent living; moved there "a few weeks ago"  PLOF:  Level of assistance: Independent with ADLs, Needed assistance with IADLS Employment: Retired   PATIENT GOALS   to improve his "thinking"  OBJECTIVE:    TODAY'S TREATMENT:  Pt seen for skilled SLP services targeting functional memory and wordfinding.   Memory compensations: Pt not bring calendar or activities calendar with him to tx. Reinforced goals of tx and rationale for use of calendar. Pt stating, he left calendar at home.   Dynamic/functional tx:  [place] Naming Test: 41/60 indep; 48/60 with cueing Pt acknowledged that he has occasional wordfinding difficulty which can be "frustrating" at times.  Introduced Clinical research associate features analysis/circumlocution for anomia. Pt required mod/max verbal cues to utilize while name x10 objects. Will continue to address in upcoming sessoins.      PATIENT EDUCATION: Education details: as above Person educated: Patient; Daughter Education method: Explanation Education comprehension: verbalized understanding and needs further education  HOME EXERCISE PROGRAM:        1) Use calendar - mark off dates, review night before  2) Bring activity calendar  from ILF    GOALS:  Goals reviewed with patient? Yes  SHORT TERM GOALS: Target date: 10 sessions  Pt will participate in further dynamic/functional assessment of pt's ability to complete iADLs (e.g. medication management, balancing a  checkbook). Baseline: Goal status: MET   2.  Pt and/or family member will ID x3 strategies to improve functional attention and memory.  Baseline:  Goal status: INITIAL  3.  Patient will create daily to-do list 5/7 days.  Baseline:  Goal status: INITIAL  4.  Patient will develop memory system and remember to bring it to >75% of sessions.  Baseline:  Goal status: INITIAL  5.  Pt will demonstrate compensatory strategies for anomia with min assistance during structured tasks. Baseline:  Goal status: INITIAL  6.  Pt will report engagement in cognitive activities 2x week outside of SLP services. Baseline:  Goal status: INITIAL  LONG TERM GOALS: Target date: 12 weeks  Patient report improved use of compensations as per improvement on PROM.  Baseline:  Goal status: INITIAL  2.  Patient will demonstrate understanding of appropriate functional tasks/strategies for daily cognitive activities.  Baseline:  Goal status: INITIAL     ASSESSMENT:  CLINICAL IMPRESSION:  Pt is 89 y.o. male who presents for cognitive-communication evaluation in setting of mild mixed dementia (Alzheimer's Disease, vascular). Pt presents with cognitive-linguistic deficits affecting attention, memory, executive functioning and higher level expressive communication. Pt with reduced insight into CLOF. Hearing likely impacting performance on initial assessment. Pt and daughter report that pt is still driving locally, manages his finances, and manages his medication. See tx details above. I recommend course of skilled ST for training in strategies to manage cognitive impairments in order to improve function and quality of life.   OBJECTIVE IMPAIRMENTS include attention, memory, awareness, executive functioning, and expressive language. These impairments are limiting patient from ADLs/IADLs and effectively communicating at home and in community. Factors affecting potential to achieve goals and functional outcome are  ability to learn/carryover information, medical prognosis, and severity of impairments.. Patient will benefit from skilled SLP services to address above impairments and improve overall function.  REHAB POTENTIAL: Good  PLAN: SLP FREQUENCY: 1-2x/week  SLP DURATION: 12 weeks  PLANNED INTERVENTIONS: Environmental controls, Cueing hierachy, Cognitive reorganization, Internal/external aids, Functional tasks, SLP instruction and feedback, Compensatory strategies, and Patient/family education    Clyde Canterbury, M.S., CCC-SLP Speech-Language Pathologist [place] [facility] [phone] Arnette Felts)  [facility] Outpatient Rehabilitation at [facility] [address] [place], [place], [address] Phone: [phone]   Fax:  [phone]

## 2023-02-19 ENCOUNTER — Ambulatory Visit: Payer: PPO | Attending: Student

## 2023-02-19 DIAGNOSIS — Z85828 Personal history of other malignant neoplasm of skin: Secondary | ICD-10-CM | POA: Diagnosis not present

## 2023-02-19 DIAGNOSIS — G3184 Mild cognitive impairment, so stated: Secondary | ICD-10-CM | POA: Insufficient documentation

## 2023-02-19 DIAGNOSIS — R41841 Cognitive communication deficit: Secondary | ICD-10-CM | POA: Diagnosis not present

## 2023-02-19 DIAGNOSIS — Z85820 Personal history of malignant melanoma of skin: Secondary | ICD-10-CM | POA: Diagnosis not present

## 2023-02-19 DIAGNOSIS — C44329 Squamous cell carcinoma of skin of other parts of face: Secondary | ICD-10-CM | POA: Diagnosis not present

## 2023-02-19 NOTE — Therapy (Signed)
OUTPATIENT SPEECH LANGUAGE PATHOLOGY  TREATMENT   Patient Name: Mark Cowan MRN: [id number] DOB:02/25/1934, 89 y.o., male Today's Date: 02/19/2023  PCP: Jerl Mina, MD REFERRING PROVIDER: Janice Coffin, PA-C   End of Session - 02/19/23 1026     Visit Number 6    Number of Visits 25    Date for SLP Re-Evaluation 04/23/23    Authorization Type [employer]    Progress Note Due on Visit 10    SLP Start Time 1027    SLP Stop Time  1100    SLP Time Calculation (min) 33 min    Activity Tolerance Patient tolerated treatment well              Patient Active Problem List   Diagnosis Date Noted   Essential hypertension 11/16/2022   Non-sustained ventricular tachycardia (HCC) 11/16/2022   Fall from ladder 11/16/2022   Pre-diabetes 11/16/2022   S/P total knee arthroplasty 11/12/2016   Diverticulitis of colon (without mention of hemorrhage)(562.11) 11/04/2012   Screening for colon cancer 11/04/2012    ONSET DATE: 01/22/23 (referral date); cognitive decline since 2022  REFERRING DIAG: mild cognitive impairment  THERAPY DIAG:  Mild cognitive impairment  Cognitive communication deficit  Rationale for Evaluation and Treatment Rehabilitation  SUBJECTIVE:   SUBJECTIVE STATEMENT: Pt alert, pleasant, and cooperative.   Pt accompanied by: self and family member; daughter  PERTINENT HISTORY: Pt is an 89 y.o. male with hx of MCI in setting of mild mixed dementia. Pt with PMHx significant of hypertension, hyperlipidemia, prediabetes, BPH, and idiopathic peripheral neuropathy.   DIAGNOSTIC FINDINGS: MRI brain, 10/01/2022 ". No acute intracranial abnormality.  2. Generalized volume loss and findings of chronic small vessel ischemia."  Per Neurology note, "APOE genetic testing shows E3/E4 variant which is associated with a slight increase risk of development of late-onset Alzheimer's Disease compared to the general population. This lab does not diagnose of  Alzheimer's Disease You had ATN panel done. A - Beta Amyloid 42/40 ratio, T- Phosphorylated- Tau 181 N- Plasma Neurofilament Light Chain Protein Reduced Beta Amyloid 42/40 ratio, elevated p-Tau-181 has been associated with presence of Alzheimer's Disease pathology."  SLUMS 10/01/22, 15/30.   PAIN:  Are you having pain? No   FALLS: Has patient fallen in last 6 months?  Yes, including fall off of a step stool  LIVING ENVIRONMENT: Lives with: lives with their spouse Lives in: House/apartment; Independent living; moved there "a few weeks ago"  PLOF:  Level of assistance: Independent with ADLs, Needed assistance with IADLS Employment: Retired   PATIENT GOALS   to improve his "thinking"  OBJECTIVE:    TODAY'S TREATMENT:  Pt seen for skilled SLP services targeting functional memory and wordfinding.   Memory compensations: Pt brought calendar and events calendar from ILF to tx this session. SLP noted several instances where pt updated calendar with additional appointments outside of tx. With min A, pt able to ID x2 events pt would like to attend at ILF for next week. Pt wrote events on calendar with min A.    Anomia tx: Reviewed  semantic features analysis/circumlocution for anomia. Pt required mod/max verbal cues to utilize while name x3 objects. Will continue to address in upcoming sessoins.    Education:   Pt and daughter educated re: importance  of routine, socialization, and physical activity in promoting cognition as well as rationale for scheduling activities at ILF. Reviewed importance of use of compensations for memory and anomia.       PATIENT EDUCATION:  Education details: as above Person educated: Patient; Daughter Education method: Explanation Education comprehension: verbalized understanding and needs further education  HOME EXERCISE PROGRAM:        1) Use calendar - mark off dates, review night before  2) Bring activity calendar from ILF - when updated  3)  Practice SFA    GOALS:  Goals reviewed with patient? Yes  SHORT TERM GOALS: Target date: 10 sessions  Pt will participate in further dynamic/functional assessment of pt's ability to complete iADLs (e.g. medication management, balancing a checkbook). Baseline: Goal status: MET   2.  Pt and/or family member will ID x3 strategies to improve functional attention and memory.  Baseline:  Goal status: INITIAL  3.  Patient will create daily to-do list 5/7 days.  Baseline:  Goal status: INITIAL  4.  Patient will develop memory system and remember to bring it to >75% of sessions.  Baseline:  Goal status: INITIAL  5.  Pt will demonstrate compensatory strategies for anomia with min assistance during structured tasks. Baseline:  Goal status: INITIAL  6.  Pt will report engagement in cognitive activities 2x week outside of SLP services. Baseline:  Goal status: INITIAL  LONG TERM GOALS: Target date: 12 weeks  Patient report improved use of compensations as per improvement on PROM.  Baseline:  Goal status: INITIAL  2.  Patient will demonstrate understanding of appropriate functional tasks/strategies for daily cognitive activities.  Baseline:  Goal status: INITIAL     ASSESSMENT:  CLINICAL IMPRESSION:  Pt is 89 y.o. male who presents for cognitive-communication evaluation in setting of mild mixed dementia (Alzheimer's Disease, vascular). Pt presents with cognitive-linguistic deficits affecting attention, memory, executive functioning and higher level expressive communication. Pt with reduced insight into CLOF. Hearing likely impacting performance on initial assessment. Pt and daughter report that pt is still driving locally, manages his finances, and manages his medication. See tx details above. I recommend course of skilled ST for training in strategies to manage cognitive impairments in order to improve function and quality of life.   OBJECTIVE IMPAIRMENTS include attention,  memory, awareness, executive functioning, and expressive language. These impairments are limiting patient from ADLs/IADLs and effectively communicating at home and in community. Factors affecting potential to achieve goals and functional outcome are ability to learn/carryover information, medical prognosis, and severity of impairments.. Patient will benefit from skilled SLP services to address above impairments and improve overall function.  REHAB POTENTIAL: Good  PLAN: SLP FREQUENCY: 1-2x/week  SLP DURATION: 12 weeks  PLANNED INTERVENTIONS: Environmental controls, Cueing hierachy, Cognitive reorganization, Internal/external aids, Functional tasks, SLP instruction and feedback, Compensatory strategies, and Patient/family education    Clyde Canterbury, M.S., CCC-SLP Speech-Language Pathologist [place] [facility] [phone] Arnette Felts)  [facility] Outpatient Rehabilitation at [facility] [address] [place], [place], [address] Phone: [phone]   Fax:  [phone]

## 2023-02-21 ENCOUNTER — Encounter: Payer: Self-pay | Admitting: Urology

## 2023-02-21 ENCOUNTER — Ambulatory Visit: Payer: PPO | Admitting: Urology

## 2023-02-21 VITALS — BP 174/72 | HR 51 | Ht 70.5 in | Wt 164.3 lb

## 2023-02-21 DIAGNOSIS — N401 Enlarged prostate with lower urinary tract symptoms: Secondary | ICD-10-CM

## 2023-02-21 NOTE — Progress Notes (Signed)
Mark Cowan,acting as a scribe for Riki Altes, MD.,have documented all relevant documentation on the behalf of Riki Altes, MD,as directed by  Riki Altes, MD while in the presence of Riki Altes, MD.  02/21/2023 11:11 AM   Mark Cowan 10-14-1933 [id number]  Referring provider: Jerl Mina, MD [address] [facility] [place],  [place] [address]  Chief Complaint  Patient presents with   Other    HPI: Mark Cowan is a 89 y.o. male who presents today to establish local urologic care.   Previously followed by Dr. Evelene Croon for BPH with lower urinary tract symptoms and was on Tamsulosin and dutasteride He has no complaints today and is voiding without problems Denies dysuria, gross hematuria Denies flank, abdominal or pelvic pain   PMH: Past Medical History:  Diagnosis Date   Arthritis    Diverticulosis    last attack 04/2011   Elevated lipids    Hypertension    dr  Rosanne Ashing   hedrick    [place]   Neuropathy    Peripheral neuropathy    Prostate enlargement     Surgical History: Past Surgical History:  Procedure Laterality Date   CARDIAC CATHETERIZATION     87     stress test   87   COLON SURGERY     part of colon removed,diseased   HERNIA REPAIR     KNEE ARTHROPLASTY Left 11/12/2016   Procedure: COMPUTER ASSISTED TOTAL KNEE ARTHROPLASTY;  Surgeon: Donato Heinz, MD;  Location: [facility] ORS;  Service: Orthopedics;  Laterality: Left;   LUMBAR LAMINECTOMY/DECOMPRESSION MICRODISCECTOMY  07/26/2011   Procedure: LUMBAR LAMINECTOMY/DECOMPRESSION MICRODISCECTOMY 2 LEVELS;  Surgeon: Cristi Loron, MD;  Location: [facility] OR[facility];  Service: Neurosurgery;  Laterality: Bilateral;  Lumbar two to four Laminectomies    MELANOMA EXCISION     lt arm   orthrocsopy  lt knee     TONSILLECTOMY      Home Medications:  Allergies as of 02/21/2023       Reactions   Statins Other (See Comments)   Muscle pain         Medication List         Accurate as of February 21, 2023 11:11 AM. If you have any questions, ask your nurse or doctor.          amLODipine 2.5 MG tablet Commonly known as: NORVASC Take 2.5 mg by mouth daily.   ascorbic acid 500 MG tablet Commonly known as: VITAMIN C Take 500 mg by mouth daily.   aspirin EC 81 MG tablet Take 81 mg by mouth daily.   CENTRUM SILVER 50+MEN PO Take by mouth.   Co Q 10 100 MG Caps Take 100 mg by mouth daily.   docusate sodium 100 MG capsule Commonly known as: COLACE Take 100 mg by mouth every evening.   dutasteride 0.5 MG capsule Commonly known as: AVODART Take 0.5 mg by mouth daily.   fenofibrate 160 MG tablet Take 160 mg by mouth every evening.   fish oil-omega-3 fatty acids 1000 MG capsule Take 1 g by mouth 2 (two) times daily.   gabapentin 300 MG capsule Commonly known as: NEURONTIN Take 300 mg by mouth every evening.   GLUCOSAMINE 1500 COMPLEX PO Take 1,500 mg by mouth 2 (two) times daily.   losartan-hydrochlorothiazide 100-25 MG tablet Commonly known as: HYZAAR Take 1 tablet by mouth daily.   Lysine 500 MG Caps Take 500 mg by mouth  daily.   memantine 5 MG tablet Commonly known as: NAMENDA Take 5 mg by mouth 2 (two) times daily.   rosuvastatin 10 MG tablet Commonly known as: CRESTOR Take 10 mg by mouth 2 (two) times a week. Tuesday and Friday   tamsulosin 0.4 MG Caps capsule Commonly known as: FLOMAX Take 0.4 mg by mouth every evening.   Vitamin B-12 2500 MCG Subl Take 2,500 mcg by mouth daily.   Vitamin D3 50 MCG (2000 UT) Tabs Take 2,000 Units by mouth daily.        Allergies:  Allergies  Allergen Reactions   Statins Other (See Comments)    Muscle pain     Family History: Family History  Problem Relation Age of Onset   Diverticulosis Father        Multiple myloma    Social History:  reports that he quit smoking about 24 years ago. He has never used smokeless tobacco. He reports current alcohol use of about 7.0 standard  drinks of alcohol per week. He reports that he does not use drugs.   Physical Exam: BP (!) 174/72   Pulse (!) 51   Ht 5' 10.5" (1.791 m)   Wt 164 lb 4.8 oz (74.5 kg)   BMI 23.24 kg/m   Constitutional:  Alert and oriented, No acute distress. HEENT: [place] AT Psychiatric: Normal mood and affect.  Assessment & Plan:    1. BPH with LUTS Stable on dutasteride/tamsulosin He did not need refills at this time Annual follow up and instructed to call earlier for any significant change in his voiding pattern   I have reviewed the above documentation for accuracy and completeness, and I agree with the above.   Riki Altes, MD  [facility] [address] [place], [place] [address] [phone]

## 2023-02-26 ENCOUNTER — Ambulatory Visit: Payer: PPO

## 2023-02-26 DIAGNOSIS — G3184 Mild cognitive impairment, so stated: Secondary | ICD-10-CM

## 2023-02-26 DIAGNOSIS — R41841 Cognitive communication deficit: Secondary | ICD-10-CM

## 2023-02-26 NOTE — Therapy (Signed)
OUTPATIENT SPEECH LANGUAGE PATHOLOGY  TREATMENT   Patient Name: Mark Cowan MRN: [id number] DOB:[birth year], 89 y.o., male Today's Date: 02/26/2023  PCP: Jerl Mina, MD REFERRING PROVIDER: Janice Coffin, PA-C   End of Session - 02/26/23 1248     Activity Tolerance Patient tolerated treatment well              Patient Active Problem List   Diagnosis Date Noted   Essential hypertension 11/16/2022   Non-sustained ventricular tachycardia (HCC) 11/16/2022   Fall from ladder 11/16/2022   Pre-diabetes 11/16/2022   S/P total knee arthroplasty 11/12/2016   Diverticulitis of colon (without mention of hemorrhage)(562.11) 11/04/2012   Screening for colon cancer 11/04/2012    ONSET DATE: 01/22/23 (referral date); cognitive decline since 2022  REFERRING DIAG: mild cognitive impairment  THERAPY DIAG:  Mild cognitive impairment  Cognitive communication deficit  Rationale for Evaluation and Treatment Rehabilitation  SUBJECTIVE:   SUBJECTIVE STATEMENT: Pt alert, pleasant, and cooperative.   Pt accompanied by: self and family member; daughter  PERTINENT HISTORY: Pt is an 89 y.o. male with hx of MCI in setting of mild mixed dementia. Pt with PMHx significant of hypertension, hyperlipidemia, prediabetes, BPH, and idiopathic peripheral neuropathy.   DIAGNOSTIC FINDINGS: MRI brain, 10/01/2022 ". No acute intracranial abnormality.  2. Generalized volume loss and findings of chronic small vessel ischemia."  Per Neurology note, "APOE genetic testing shows E3/E4 variant which is associated with a slight increase risk of development of late-onset Alzheimer's Disease compared to the general population. This lab does not diagnose of Alzheimer's Disease You had ATN panel done. A - Beta Amyloid 42/40 ratio, T- Phosphorylated- Tau 181 N- Plasma Neurofilament Light Chain Protein Reduced Beta Amyloid 42/40 ratio, elevated p-Tau-181 has been associated with presence of Alzheimer's  Disease pathology."  SLUMS 10/01/22, 15/30.   PAIN:  Are you having pain? No   FALLS: Has patient fallen in last 6 months?  Yes, including fall off of a step stool  LIVING ENVIRONMENT: Lives with: lives with their spouse Lives in: House/apartment; Independent living; moved there "a few weeks ago"  PLOF:  Level of assistance: Independent with ADLs, Needed assistance with IADLS Employment: Retired   PATIENT GOALS   to improve his "thinking"  OBJECTIVE:    TODAY'S TREATMENT:  Pt seen for skilled SLP services targeting functional memory and wordfinding.   Memory compensations: Pt brought calendar. SLP noted several instances where pt updated calendar with additional appointments outside of tx. Pt encouraged to mark off completed items/dates to help pt more readily locate upcoming dates/appointments. Pt answered temporal orientation questions as well as questions re: upcoming appoinments with extra time and while referring to calendar.   Anomia tx: Reviewed semantic features analysis/circumlocution for anomia. Pt required rare-min verbal cues to utilize while name x5 objects. Marked improving in wordfinding and use of strategy this date.    Education:  Pt and daughter educated re: importance of routine, socialization, and physical activity in promoting cognition as well as rationale for scheduling activities at [facility]F. Pt noted attending [employer] programming at ALF (which he wrote on his calendar). Pt able to recall specific information re: programming. Reviewed importance of use of compensations for memory and anomia.       PATIENT EDUCATION: Education details: as above Person educated: Patient; Daughter Education method: Explanation Education comprehension: verbalized understanding and needs further education  HOME EXERCISE PROGRAM:        1) Use calendar - mark off dates, review night before  2)  Bring activity calendar from ILF - when updated  3) Practice  SFA    GOALS:  Goals reviewed with patient? Yes  SHORT TERM GOALS: Target date: 10 sessions  Pt will participate in further dynamic/functional assessment of pt's ability to complete iADLs (e.g. medication management, balancing a checkbook). Baseline: Goal status: MET   2.  Pt and/or family member will ID x3 strategies to improve functional attention and memory.  Baseline:  Goal status: INITIAL  3.  Patient will create daily to-do list 5/7 days.  Baseline:  Goal status: INITIAL  4.  Patient will develop memory system and remember to bring it to >75% of sessions.  Baseline:  Goal status: INITIAL  5.  Pt will demonstrate compensatory strategies for anomia with min assistance during structured tasks. Baseline:  Goal status: INITIAL  6.  Pt will report engagement in cognitive activities 2x week outside of SLP services. Baseline:  Goal status: INITIAL  LONG TERM GOALS: Target date: 12 weeks  Patient report improved use of compensations as per improvement on PROM.  Baseline:  Goal status: INITIAL  2.  Patient will demonstrate understanding of appropriate functional tasks/strategies for daily cognitive activities.  Baseline:  Goal status: INITIAL     ASSESSMENT:  CLINICAL IMPRESSION:  Pt is 89 y.o. male who presents for cognitive-communication evaluation in setting of mild mixed dementia (Alzheimer's Disease, vascular). Pt presents with cognitive-linguistic deficits affecting attention, memory, executive functioning and higher level expressive communication. Pt with reduced insight into CLOF. Hearing likely impacting performance on initial assessment. Pt and daughter report that pt is still driving locally, manages his finances, and manages his medication. See tx details above. I recommend course of skilled ST for training in strategies to manage cognitive impairments in order to improve function and quality of life.   OBJECTIVE IMPAIRMENTS include attention, memory,  awareness, executive functioning, and expressive language. These impairments are limiting patient from ADLs/IADLs and effectively communicating at home and in community. Factors affecting potential to achieve goals and functional outcome are ability to learn/carryover information, medical prognosis, and severity of impairments.. Patient will benefit from skilled SLP services to address above impairments and improve overall function.  REHAB POTENTIAL: Good  PLAN: SLP FREQUENCY: 1-2x/week  SLP DURATION: 12 weeks  PLANNED INTERVENTIONS: Environmental controls, Cueing hierachy, Cognitive reorganization, Internal/external aids, Functional tasks, SLP instruction and feedback, Compensatory strategies, and Patient/family education    Clyde Canterbury, M.S., CCC-SLP Speech-Language Pathologist [place] [facility] [phone] Arnette Felts)  [facility] at [facility] [address] [place], [place], [address] Phone: [phone]   Fax:  [phone]

## 2023-03-04 ENCOUNTER — Ambulatory Visit: Payer: PPO

## 2023-03-04 DIAGNOSIS — E782 Mixed hyperlipidemia: Secondary | ICD-10-CM | POA: Diagnosis not present

## 2023-03-04 DIAGNOSIS — G3184 Mild cognitive impairment, so stated: Secondary | ICD-10-CM | POA: Diagnosis not present

## 2023-03-04 DIAGNOSIS — Z125 Encounter for screening for malignant neoplasm of prostate: Secondary | ICD-10-CM | POA: Diagnosis not present

## 2023-03-04 DIAGNOSIS — R41841 Cognitive communication deficit: Secondary | ICD-10-CM

## 2023-03-04 DIAGNOSIS — I10 Essential (primary) hypertension: Secondary | ICD-10-CM | POA: Diagnosis not present

## 2023-03-04 DIAGNOSIS — R7303 Prediabetes: Secondary | ICD-10-CM | POA: Diagnosis not present

## 2023-03-04 NOTE — Therapy (Signed)
OUTPATIENT SPEECH LANGUAGE PATHOLOGY  TREATMENT   Patient Name: Mark Cowan MRN: [id number] DOB:01/07/1934, 89 y.o., male Today's Date: 03/04/2023  PCP: Jerl Mina, MD REFERRING PROVIDER: Janice Coffin, PA-C   End of Session - 03/04/23 1207     Visit Number 8    Number of Visits 25    Date for SLP Re-Evaluation 04/23/23    Authorization Type [employer] P[employer]    Progress Note Due on Visit 10    SLP Start Time 1102    SLP Stop Time  1140    SLP Time Calculation (min) 38 min    Activity Tolerance Patient tolerated treatment well              Patient Active Problem List   Diagnosis Date Noted   Essential hypertension 11/16/2022   Non-sustained ventricular tachycardia (HCC) 11/16/2022   Fall from ladder 11/16/2022   Pre-diabetes 11/16/2022   S/P total knee arthroplasty 11/12/2016   Diverticulitis of colon (without mention of hemorrhage)(562.11) 11/04/2012   Screening for colon cancer 11/04/2012    ONSET DATE: 01/22/23 (referral date); cognitive decline since 2022  REFERRING DIAG: mild cognitive impairment  THERAPY DIAG:  Mild cognitive impairment  Cognitive communication deficit  Rationale for Evaluation and Treatment Rehabilitation  SUBJECTIVE:   SUBJECTIVE STATEMENT: Pt alert, pleasant, and cooperative.   Pt accompanied by: self   PERTINENT HISTORY: Pt is an 89 y.o. male with hx of MCI in setting of mild mixed dementia. Pt with PMHx significant of hypertension, hyperlipidemia, prediabetes, BPH, and idiopathic peripheral neuropathy.   DIAGNOSTIC FINDINGS: MRI brain, 10/01/2022 ". No acute intracranial abnormality.  2. Generalized volume loss and findings of chronic small vessel ischemia."  Per Neurology note, "APOE genetic testing shows E3/E4 variant which is associated with a slight increase risk of development of late-onset Alzheimer's Disease compared to the general population. This lab does not diagnose of Alzheimer's Disease You had  ATN panel done. A - Beta Amyloid 42/40 ratio, T- Phosphorylated- Tau 181 N- Plasma Neurofilament Light Chain Protein Reduced Beta Amyloid 42/40 ratio, elevated p-Tau-181 has been associated with presence of Alzheimer's Disease pathology."  SLUMS 10/01/22, 15/30.   PAIN:  Are you having pain? No   FALLS: Has patient fallen in last 6 months?  Yes, including fall off of a step stool  LIVING ENVIRONMENT: Lives with: lives with their spouse Lives in: House/apartment; Independent living; moved there "a few weeks ago"  PLOF:  Level of assistance: Independent with ADLs, Needed assistance with IADLS Employment: Retired   PATIENT GOALS   to improve his "thinking"  OBJECTIVE:    TODAY'S TREATMENT:  Pt seen for skilled SLP services targeting functional memory and wordfinding.   Memory compensations: Pt brought calendar. SLP noted several instances where pt updated calendar with additional appointments outside of tx. Pt encouraged to mark off completed items/dates to help pt more readily locate upcoming dates/appointments. Pt answered temporal orientation questions as well as questions re: upcoming appoinments with extra time and while referring to calendar.   Anomia tx: Reviewed semantic features analysis/circumlocution for anomia. Pt required rare verbal cues to utilize while name x10 objects during barrier task. Marked improving in wordfinding and use of strategy this date.    Education:  Reviewed importance of routine, socialization, and physical activity in promoting cognition as well as rationale for scheduling activities at ILF. Pt noted attending Holocaust education programming at ALF (which he wrote on his calendar). Pt able to recall specific information re: programming. Reviewed importance  of use of compensations for memory and anomia.       PATIENT EDUCATION: Education details: as above Person educated: Patient; Daughter Education method: Explanation Education  comprehension: verbalized understanding and needs further education  HOME EXERCISE PROGRAM:        1) Use calendar - mark off dates, review night before  2) Bring activity calendar from ILF - when updated  3) Practice SFA    GOALS:  Goals reviewed with patient? Yes  SHORT TERM GOALS: Target date: 10 sessions  Pt will participate in further dynamic/functional assessment of pt's ability to complete iADLs (e.g. medication management, balancing a checkbook). Baseline: Goal status: MET   2.  Pt and/or family member will ID x3 strategies to improve functional attention and memory.  Baseline:  Goal status: INITIAL  3.  Patient will create daily to-do list 5/7 days.  Baseline:  Goal status: INITIAL  4.  Patient will develop memory system and remember to bring it to >75% of sessions.  Baseline:  Goal status: INITIAL  5.  Pt will demonstrate compensatory strategies for anomia with min assistance during structured tasks. Baseline:  Goal status: INITIAL  6.  Pt will report engagement in cognitive activities 2x week outside of SLP services. Baseline:  Goal status: INITIAL  LONG TERM GOALS: Target date: 12 weeks  Patient report improved use of compensations as per improvement on PROM.  Baseline:  Goal status: INITIAL  2.  Patient will demonstrate understanding of appropriate functional tasks/strategies for daily cognitive activities.  Baseline:  Goal status: INITIAL     ASSESSMENT:  CLINICAL IMPRESSION:  Pt is 89 y.o. male who presents for cognitive-communication evaluation in setting of mild mixed dementia (Alzheimer's Disease, vascular). Pt presents with cognitive-linguistic deficits affecting attention, memory, executive functioning and higher level expressive communication. Pt with reduced insight into CLOF. Hearing likely impacting performance on initial assessment. Pt and daughter report that pt is still driving locally, manages his finances, and manages his medication.  See tx details above. I recommend course of skilled ST for training in strategies to manage cognitive impairments in order to improve function and quality of life.   OBJECTIVE IMPAIRMENTS include attention, memory, awareness, executive functioning, and expressive language. These impairments are limiting patient from ADLs/IADLs and effectively communicating at home and in community. Factors affecting potential to achieve goals and functional outcome are ability to learn/carryover information, medical prognosis, and severity of impairments.. Patient will benefit from skilled SLP services to address above impairments and improve overall function.  REHAB POTENTIAL: Good  PLAN: SLP FREQUENCY: 1-2x/week  SLP DURATION: 12 weeks  PLANNED INTERVENTIONS: Environmental controls, Cueing hierachy, Cognitive reorganization, Internal/external aids, Functional tasks, SLP instruction and feedback, Compensatory strategies, and Patient/family education    Clyde Canterbury, M.S., CCC-SLP Speech-Language Pathologist [place] [facility] [phone] Arnette Felts)  [facility] Outpatient Rehabilitation at [facility] [address] [place], [place], [address] Phone: [phone]   Fax:  [phone]

## 2023-03-07 ENCOUNTER — Ambulatory Visit: Payer: PPO

## 2023-03-07 DIAGNOSIS — G3184 Mild cognitive impairment, so stated: Secondary | ICD-10-CM

## 2023-03-07 DIAGNOSIS — R41841 Cognitive communication deficit: Secondary | ICD-10-CM

## 2023-03-07 NOTE — Therapy (Signed)
OUTPATIENT SPEECH LANGUAGE PATHOLOGY  TREATMENT   Patient Name: Mark Cowan MRN: [id number] DOB:Apr 13, 1934, 89 y.o., male Today's Date: 03/07/2023  PCP: Jerl Mina, MD REFERRING PROVIDER: Janice Coffin, PA-C   End of Session - 03/07/23 1009     Visit Number 9    Number of Visits 25    Date for SLP Re-Evaluation 04/23/23    Authorization Type [employer] P[employer]    Progress Note Due on Visit 10    SLP Start Time 0930    SLP Stop Time  1010    SLP Time Calculation (min) 40 min    Activity Tolerance Patient tolerated treatment well              Patient Active Problem List   Diagnosis Date Noted   Essential hypertension 11/16/2022   Non-sustained ventricular tachycardia (HCC) 11/16/2022   Fall from ladder 11/16/2022   Pre-diabetes 11/16/2022   S/P total knee arthroplasty 11/12/2016   Diverticulitis of colon (without mention of hemorrhage)(562.11) 11/04/2012   Screening for colon cancer 11/04/2012    ONSET DATE: 01/22/23 (referral date); cognitive decline since 2022  REFERRING DIAG: mild cognitive impairment  THERAPY DIAG:  Mild cognitive impairment  Cognitive communication deficit  Rationale for Evaluation and Treatment Rehabilitation  SUBJECTIVE:   SUBJECTIVE STATEMENT: Pt alert, pleasant, and cooperative.   Pt accompanied by: self   PERTINENT HISTORY: Pt is an 89 y.o. male with hx of MCI in setting of mild mixed dementia. Pt with PMHx significant of hypertension, hyperlipidemia, prediabetes, BPH, and idiopathic peripheral neuropathy.   DIAGNOSTIC FINDINGS: MRI brain, 10/01/2022 ". No acute intracranial abnormality.  2. Generalized volume loss and findings of chronic small vessel ischemia."  Per Neurology note, "APOE genetic testing shows E3/E4 variant which is associated with a slight increase risk of development of late-onset Alzheimer's Disease compared to the general population. This lab does not diagnose of Alzheimer's Disease You had  ATN panel done. A - Beta Amyloid 42/40 ratio, T- Phosphorylated- Tau 181 N- Plasma Neurofilament Light Chain Protein Reduced Beta Amyloid 42/40 ratio, elevated p-Tau-181 has been associated with presence of Alzheimer's Disease pathology."  SLUMS 10/01/22, 15/30.   PAIN:  Are you having pain? No   FALLS: Has patient fallen in last 6 months?  Yes, including fall off of a step stool  LIVING ENVIRONMENT: Lives with: lives with their spouse Lives in: House/apartment; Independent living; moved there "a few weeks ago"  PLOF:  Level of assistance: Independent with ADLs, Needed assistance with IADLS Employment: Retired   PATIENT GOALS   to improve his "thinking"  OBJECTIVE:    TODAY'S TREATMENT:  Pt seen for skilled SLP services targeting functional memory and wordfinding.   Memory compensations: Pt brought calendar. SLP noted several instances where pt updated calendar with additional appointments outside of tx. Pt encouraged to mark off completed items/dates to help pt more readily locate upcoming dates/appointments. Pt answered temporal orientation questions as well as questions re: upcoming appoinments with extra time and while referring to calendar. Pt able to ID error on calendar and amend with extra time.    Education:  Additional education completed re: ways to promote cognitive functioning. Reviewed importance of routine, socialization, and physical activity in promoting cognition as well as rationale for scheduling activities at ILF. Introduced concept of neuroplasticity and challenging your brain to learn something new. Reviewed importance of use of compensations for memory and anomia.       PATIENT EDUCATION: Education details: as above Person educated: Patient;  Handout Education method: Explanation Education comprehension: verbalized understanding and needs further education  HOME EXERCISE PROGRAM:        1) Use calendar - mark off dates, review night before  2)  Bring activity calendar from ILF - when updated     GOALS:  Goals reviewed with patient? Yes  SHORT TERM GOALS: Target date: 10 sessions  Pt will participate in further dynamic/functional assessment of pt's ability to complete iADLs (e.g. medication management, balancing a checkbook). Baseline: Goal status: MET   2.  Pt and/or family member will ID x3 strategies to improve functional attention and memory.  Baseline:  Goal status: INITIAL  3.  Patient will create daily to-do list 5/7 days.  Baseline:  Goal status: INITIAL  4.  Patient will develop memory system and remember to bring it to >75% of sessions.  Baseline:  Goal status: INITIAL  5.  Pt will demonstrate compensatory strategies for anomia with min assistance during structured tasks. Baseline:  Goal status: INITIAL  6.  Pt will report engagement in cognitive activities 2x week outside of SLP services. Baseline:  Goal status: INITIAL  LONG TERM GOALS: Target date: 12 weeks  Patient report improved use of compensations as per improvement on PROM.  Baseline:  Goal status: INITIAL  2.  Patient will demonstrate understanding of appropriate functional tasks/strategies for daily cognitive activities.  Baseline:  Goal status: INITIAL     ASSESSMENT:  CLINICAL IMPRESSION:  Pt is 89 y.o. male who presents for cognitive-communication evaluation in setting of mild mixed dementia (Alzheimer's Disease, vascular). Pt presents with cognitive-linguistic deficits affecting attention, memory, executive functioning and higher level expressive communication. Pt with reduced insight into CLOF. Hearing likely impacting performance on initial assessment. Pt and daughter report that pt is still driving locally, manages his finances, and manages his medication. See tx details above. I recommend course of skilled ST for training in strategies to manage cognitive impairments in order to improve function and quality of life.    OBJECTIVE IMPAIRMENTS include attention, memory, awareness, executive functioning, and expressive language. These impairments are limiting patient from ADLs/IADLs and effectively communicating at home and in community. Factors affecting potential to achieve goals and functional outcome are ability to learn/carryover information, medical prognosis, and severity of impairments.. Patient will benefit from skilled SLP services to address above impairments and improve overall function.  REHAB POTENTIAL: Good  PLAN: SLP FREQUENCY: 1-2x/week  SLP DURATION: 12 weeks  PLANNED INTERVENTIONS: Environmental controls, Cueing hierachy, Cognitive reorganization, Internal/external aids, Functional tasks, SLP instruction and feedback, Compensatory strategies, and Patient/family education    Clyde Canterbury, M.S., CCC-SLP Speech-Language Pathologist [place] [facility] [phone] Arnette Felts)  [place] [facility] Outpatient Rehabilitation at [facility] [address] [place], [place], [address] Phone: [phone]   Fax:  [phone]

## 2023-03-11 DIAGNOSIS — I10 Essential (primary) hypertension: Secondary | ICD-10-CM | POA: Diagnosis not present

## 2023-03-11 DIAGNOSIS — E782 Mixed hyperlipidemia: Secondary | ICD-10-CM | POA: Diagnosis not present

## 2023-03-11 DIAGNOSIS — Z0000 Encounter for general adult medical examination without abnormal findings: Secondary | ICD-10-CM | POA: Diagnosis not present

## 2023-03-11 DIAGNOSIS — Z1331 Encounter for screening for depression: Secondary | ICD-10-CM | POA: Diagnosis not present

## 2023-03-12 ENCOUNTER — Ambulatory Visit: Payer: PPO

## 2023-03-12 ENCOUNTER — Telehealth: Payer: Self-pay

## 2023-03-12 NOTE — Telephone Encounter (Signed)
Pt "no showed" for today's ST appointment. SLP was able to reach pt via telephone. Pt stated he had a "conflict" today. Pt apologetic and able to confirm next scheduled ST appointment.   Clyde Canterbury, M.S., CCC-SLP Speech-Language Pathologist [facility] [phone] (ASCOM)

## 2023-03-18 ENCOUNTER — Ambulatory Visit: Payer: PPO | Attending: Student

## 2023-03-18 DIAGNOSIS — G3184 Mild cognitive impairment, so stated: Secondary | ICD-10-CM | POA: Insufficient documentation

## 2023-03-18 DIAGNOSIS — R41841 Cognitive communication deficit: Secondary | ICD-10-CM | POA: Insufficient documentation

## 2023-03-18 NOTE — Therapy (Signed)
OUTPATIENT SPEECH LANGUAGE PATHOLOGY  TREATMENT / 10th VISIT PROGRESS NOTE   Speech Therapy Progress Note  Dates of Reporting Period: 01/29/23 to 03/18/23  Objective: Patient has been seen for 10 speech therapy sessions this reporting period targeting functional cognitive-linguistic tx. Patient is making progress toward LTGs and met 4 of 6 STGs this reporting period. See skilled intervention, clinical impressions, and goals below for details.  Patient Name: Mark Cowan MRN: [id number] DOB:09/13/1933, 89 y.o., male Today's Date: 03/18/2023  PCP: Mark Mina, MD REFERRING PROVIDER: Janice Coffin, PA-C   End of Session - 03/18/23 1058     Visit Number 10    Number of Visits 25    Date for SLP Re-Evaluation 04/23/23    Authorization Type [employer] P[employer]    Progress Note Due on Visit 10    SLP Start Time 1032    SLP Stop Time  1100    SLP Time Calculation (min) 28 min    Activity Tolerance Patient tolerated treatment well              Patient Active Problem List   Diagnosis Date Noted   Essential hypertension 11/16/2022   Non-sustained ventricular tachycardia (HCC) 11/16/2022   Fall from ladder 11/16/2022   Pre-diabetes 11/16/2022   S/P total knee arthroplasty 11/12/2016   Diverticulitis of colon (without mention of hemorrhage)(562.11) 11/04/2012   Screening for colon cancer 11/04/2012    ONSET DATE: 01/22/23 (referral date); cognitive decline since 2022  REFERRING DIAG: mild cognitive impairment  THERAPY DIAG:  Mild cognitive impairment  Cognitive communication deficit  Rationale for Evaluation and Treatment Rehabilitation  SUBJECTIVE:   SUBJECTIVE STATEMENT: Pt alert, pleasant, and cooperative. Apologetic for "running late."  Pt accompanied by: self   PERTINENT HISTORY: Pt is an 89 y.o. male with hx of MCI in setting of mild mixed dementia. Pt with PMHx significant of hypertension, hyperlipidemia, prediabetes, BPH, and idiopathic peripheral  neuropathy.   DIAGNOSTIC FINDINGS: MRI brain, 10/01/2022 ". No acute intracranial abnormality.  2. Generalized volume loss and findings of chronic small vessel ischemia."  Per Neurology note, "APOE genetic testing shows E3/E4 variant which is associated with a slight increase risk of development of late-onset Alzheimer's Disease compared to the general population. This lab does not diagnose of Alzheimer's Disease You had ATN panel done. A - Beta Amyloid 42/40 ratio, T- Phosphorylated- Tau 181 N- Plasma Neurofilament Light Chain Protein Reduced Beta Amyloid 42/40 ratio, elevated p-Tau-181 has been associated with presence of Alzheimer's Disease pathology."  SLUMS 10/01/22, 15/30.   PAIN:  Are you having pain? No   FALLS: Has patient fallen in last 6 months?  Yes, including fall off of a step stool  LIVING ENVIRONMENT: Lives with: lives with their spouse Lives in: House/apartment; Independent living; moved there "a few weeks ago"  PLOF:  Level of assistance: Independent with ADLs, Needed assistance with IADLS Employment: Retired   PATIENT GOALS   to improve his "thinking"  OBJECTIVE:    TODAY'S TREATMENT:  Pt seen for skilled SLP services targeting functional memory and wordfinding.   Memory compensations: Pt brought calendar. SLP noted several instances where pt updated calendar with additional appointments outside of tx. Pt encouraged to mark off completed items/dates to help pt more readily locate upcoming dates/appointments. Pt answered temporal orientation questions as well as questions re: upcoming appoinments with extra time and while referring to calendar. Pt able to ID error on calendar and amend with extra time.  Wordfinding: Reviewed circumlocution strategy for wordfinding.  Pt utilized with rare-min cues during structured task. No overt s/sx anomia during during conversational exchanges.    Education:  Additional education completed re: ways to promote cognitive  functioning. Reviewed importance of routine, socialization, and physical activity in promoting cognition as well as rationale for scheduling activities at ILF. Pt able to recall x3 attention/memory strategies he has been utilizing with min cueing. Reviewed importance of use of compensations for memory and anomia.       PATIENT EDUCATION: Education details: as above Person educated: Patient; Chemical engineer: Explanation Education comprehension: verbalized understanding and needs further education  HOME EXERCISE PROGRAM:        1) Use calendar - mark off dates, review night before  2) Bring activity calendar from ILF - when updated     GOALS:  Goals reviewed with patient? Yes  SHORT TERM GOALS: Target date: 10 sessions  Pt will participate in further dynamic/functional assessment of pt's ability to complete iADLs (e.g. medication management, balancing a checkbook). Baseline: Goal status: MET   2.  Pt and/or family member will ID x3 strategies to improve functional attention and memory.  Baseline:  Goal status: MET  3.  Patient will create daily to-do list 5/7 days.  Baseline:  Goal status: NOT MET; d/c goal; pt prefers to utilize calendar for action items  4.  Patient will develop memory system and remember to bring it to >75% of sessions.  Baseline:  Goal status: MET  5.  Pt will demonstrate compensatory strategies for anomia with min assistance during structured tasks. Baseline:  Goal status: MET  6.  Pt will report engagement in cognitive activities 2x week outside of SLP services. Baseline:  Goal status: IN PROGRESS  LONG TERM GOALS: Target date: 12 weeks  Patient report improved use of compensations as per improvement on PROM.  Baseline:  Goal status: IN PROGRESS  2.  Patient will demonstrate understanding of appropriate functional tasks/strategies for daily cognitive activities.  Baseline:  Goal status: IN PROGRESS     ASSESSMENT:  CLINICAL  IMPRESSION:  Pt is 89 y.o. male who presents for cognitive-communication evaluation in setting of mild mixed dementia (Alzheimer's Disease, vascular). Pt presents with cognitive-linguistic deficits affecting attention, memory, executive functioning and higher level expressive communication. Pt with reduced insight into CLOF. Hearing likely impacting performance on initial assessment. Pt and daughter report that pt is still driving locally, manages his finances, and manages his medication. See tx details above. I recommend course of skilled ST for training in strategies to manage cognitive impairments in order to improve function and quality of life.   OBJECTIVE IMPAIRMENTS include attention, memory, awareness, executive functioning, and expressive language. These impairments are limiting patient from ADLs/IADLs and effectively communicating at home and in community. Factors affecting potential to achieve goals and functional outcome are ability to learn/carryover information, medical prognosis, and severity of impairments.. Patient will benefit from skilled SLP services to address above impairments and improve overall function.  REHAB POTENTIAL: Good  PLAN: SLP FREQUENCY: 1-2x/week  SLP DURATION: 12 weeks  PLANNED INTERVENTIONS: Environmental controls, Cueing hierachy, Cognitive reorganization, Internal/external aids, Functional tasks, SLP instruction and feedback, Compensatory strategies, and Patient/family education    Clyde Canterbury, M.S., CCC-SLP Speech-Language Pathologist [place] [facility] [phone] Arnette Felts)  [place] [facility] at [facility] [address] [place], [place], [address] Phone: [phone]   Fax:  [phone]

## 2023-03-21 ENCOUNTER — Ambulatory Visit: Payer: PPO

## 2023-03-21 DIAGNOSIS — R41841 Cognitive communication deficit: Secondary | ICD-10-CM

## 2023-03-21 DIAGNOSIS — G3184 Mild cognitive impairment, so stated: Secondary | ICD-10-CM | POA: Diagnosis not present

## 2023-03-21 NOTE — Therapy (Signed)
OUTPATIENT SPEECH LANGUAGE PATHOLOGY  TREATMENT    Patient Name: Mark Cowan MRN: [id number] DOB:1933/07/05, 89 y.o., male Today's Date: 03/21/2023  PCP: Jerl Mina, MD REFERRING PROVIDER: Janice Coffin, PA-C   End of Session - 03/21/23 1155     Visit Number 11    Number of Visits 25    Date for SLP Re-Evaluation 04/23/23    Authorization Type [employer]    Progress Note Due on Visit 20    SLP Start Time 1105    SLP Stop Time  1150    SLP Time Calculation (min) 45 min    Activity Tolerance Patient tolerated treatment well              Patient Active Problem List   Diagnosis Date Noted   Essential hypertension 11/16/2022   Non-sustained ventricular tachycardia (HCC) 11/16/2022   Fall from ladder 11/16/2022   Pre-diabetes 11/16/2022   S/P total knee arthroplasty 11/12/2016   Diverticulitis of colon (without mention of hemorrhage)(562.11) 11/04/2012   Screening for colon cancer 11/04/2012    ONSET DATE: 01/22/23 (referral date); cognitive decline since 2022  REFERRING DIAG: mild cognitive impairment  THERAPY DIAG:  Mild cognitive impairment  Cognitive communication deficit  Rationale for Evaluation and Treatment Rehabilitation  SUBJECTIVE:   SUBJECTIVE STATEMENT: Pt alert, pleasant, and cooperative.   Pt accompanied by: self; daughter  PERTINENT HISTORY: Pt is an 89 y.o. male with hx of MCI in setting of mild mixed dementia. Pt with PMHx significant of hypertension, hyperlipidemia, prediabetes, BPH, and idiopathic peripheral neuropathy.   DIAGNOSTIC FINDINGS: MRI brain, 10/01/2022 ". No acute intracranial abnormality.  2. Generalized volume loss and findings of chronic small vessel ischemia."  Per Neurology note, "APOE genetic testing shows E3/E4 variant which is associated with a slight increase risk of development of late-onset Alzheimer's Disease compared to the general population. This lab does not diagnose of Alzheimer's  Disease You had ATN panel done. A - Beta Amyloid 42/40 ratio, T- Phosphorylated- Tau 181 N- Plasma Neurofilament Light Chain Protein Reduced Beta Amyloid 42/40 ratio, elevated p-Tau-181 has been associated with presence of Alzheimer's Disease pathology."  SLUMS 10/01/22, 15/30.   PAIN:  Are you having pain? No   FALLS: Has patient fallen in last 6 months?  Yes, including fall off of a step stool  LIVING ENVIRONMENT: Lives with: lives with their spouse Lives in: House/apartment; Independent living; moved there "a few weeks ago"  PLOF:  Level of assistance: Independent with ADLs, Needed assistance with IADLS Employment: Retired   PATIENT GOALS   to improve his "thinking"  OBJECTIVE:    TODAY'S TREATMENT:  Pt seen for skilled SLP services targeting functional memory.   Memory compensations: Pt brought calendar. SLP noted several instances where pt updated calendar with additional appointments outside of tx. Pt answered temporal orientation questions as well as questions re: upcoming appoinments with extra time and while referring to calendar. Pt able to add additional social events from ILF calendar with extra time. Daughter endorsed pt with difficulty finding commonly misplaced items. Introduced Fish farm manager of having a "home" for commonly used objects. Pt tasks with finding a "home" for at least x2 items he identified as routinely misplaced (e.g. phone, wallet, keys, glasses).  Daughter also noted pt having difficulty recalling recent events. Introduced Orthoptist as a way to compensate. Pt tasked with establishing a simple journal of pertinent daily events. Pt provided with blank notebook to do so. Pt wrote date, DOW, and brief synopsis of tx session /  use of notebook with mod A.     Education:  Additional education completed re: ways to promote cognitive functioning as well as rationale for compensations as outlined above. Reviewed importance of routine, socialization, and physical  activity in promoting cognition as well as rationale for scheduling activities at ILF.       PATIENT EDUCATION: Education details: as above Person educated: Patient; Daughter Education method: Explanation Education comprehension: verbalized understanding and needs further education  HOME EXERCISE PROGRAM:        1) Continue to use calendar  2) Find "home" for x2 commonly misplaced items  3) Start utilizing journal for pertinent events     GOALS:  Goals reviewed with patient? Yes  SHORT TERM GOALS: Target date: 10 sessions  Pt will participate in further dynamic/functional assessment of pt's ability to complete iADLs (e.g. medication management, balancing a checkbook). Baseline: Goal status: MET   2.  Pt and/or family member will ID x3 strategies to improve functional attention and memory.  Baseline:  Goal status: MET  3.  Patient will create daily to-do list 5/7 days.  Baseline:  Goal status: NOT MET; d/c goal; pt prefers to utilize calendar for action items  4.  Patient will develop memory system and remember to bring it to >75% of sessions.  Baseline:  Goal status: MET  5.  Pt will demonstrate compensatory strategies for anomia with min assistance during structured tasks. Baseline:  Goal status: MET  6.  Pt will report engagement in cognitive activities 2x week outside of SLP services. Baseline:  Goal status: IN PROGRESS  7. Pt will use journaling to improve recall of pertinent events with min A.    Baseline:  Goal status: new, 03/21/23  LONG TERM GOALS: Target date: 12 weeks  Patient report improved use of compensations as per improvement on PROM.  Baseline:  Goal status: IN PROGRESS  2.  Patient will demonstrate understanding of appropriate functional tasks/strategies for daily cognitive activities.  Baseline:  Goal status: IN PROGRESS     ASSESSMENT:  CLINICAL IMPRESSION:  Pt is 89 y.o. male who presents for cognitive-communication evaluation  in setting of mild mixed dementia (Alzheimer's Disease, vascular). Pt presents with cognitive-linguistic deficits affecting attention, memory, executive functioning and higher level expressive communication. Pt with reduced insight into CLOF. Hearing likely impacting performance on initial assessment. Pt and daughter report that pt is still driving locally, manages his finances, and manages his medication. See tx details above. I recommend course of skilled ST for training in strategies to manage cognitive impairments in order to improve function and quality of life.   OBJECTIVE IMPAIRMENTS include attention, memory, awareness, executive functioning, and expressive language. These impairments are limiting patient from ADLs/IADLs and effectively communicating at home and in community. Factors affecting potential to achieve goals and functional outcome are ability to learn/carryover information, medical prognosis, and severity of impairments.. Patient will benefit from skilled SLP services to address above impairments and improve overall function.  REHAB POTENTIAL: Good  PLAN: SLP FREQUENCY: 1-2x/week  SLP DURATION: 12 weeks  PLANNED INTERVENTIONS: Environmental controls, Cueing hierachy, Cognitive reorganization, Internal/external aids, Functional tasks, SLP instruction and feedback, Compensatory strategies, and Patient/family education    Clyde Canterbury, M.S., CCC-SLP Speech-Language Pathologist [place] [facility] [phone] Arnette Felts)  [facility] Out[facility] Rehabilitation at [facility] [address] [place], [place], [address] Phone: [phone]   Fax:  [phone]

## 2023-03-25 ENCOUNTER — Ambulatory Visit: Payer: PPO

## 2023-03-25 DIAGNOSIS — R41841 Cognitive communication deficit: Secondary | ICD-10-CM

## 2023-03-25 DIAGNOSIS — G3184 Mild cognitive impairment, so stated: Secondary | ICD-10-CM | POA: Diagnosis not present

## 2023-03-25 NOTE — Therapy (Signed)
OUTPATIENT SPEECH LANGUAGE PATHOLOGY  TREATMENT    Patient Name: Mark Cowan MRN: [id number] DOB:Oct 16, 1933, 89 y.o., male Today's Date: 03/25/2023  PCP: Jerl Mina, MD REFERRING PROVIDER: Janice Coffin, PA-C   End of Session - 03/25/23 1158     Visit Number 12    Number of Visits 25    Date for SLP Re-Evaluation 04/23/23    Authorization Type [employer] P[employer]    Progress Note Due on Visit 20    SLP Start Time 1117    SLP Stop Time  1147    SLP Time Calculation (min) 30 min    Activity Tolerance Patient tolerated treatment well              Patient Active Problem List   Diagnosis Date Noted   Essential hypertension 11/16/2022   Non-sustained ventricular tachycardia (HCC) 11/16/2022   Fall from ladder 11/16/2022   Pre-diabetes 11/16/2022   S/P total knee arthroplasty 11/12/2016   Diverticulitis of colon (without mention of hemorrhage)(562.11) 11/04/2012   Screening for colon cancer 11/04/2012    ONSET DATE: 01/22/23 (referral date); cognitive decline since 2022  REFERRING DIAG: mild cognitive impairment  THERAPY DIAG:  Mild cognitive impairment  Cognitive communication deficit  Rationale for Evaluation and Treatment Rehabilitation  SUBJECTIVE:   SUBJECTIVE STATEMENT: Pt alert, pleasant, and cooperative.   Pt accompanied by: self;   PERTINENT HISTORY: Pt is an 89 y.o. male with hx of MCI in setting of mild mixed dementia. Pt with PMHx significant of hypertension, hyperlipidemia, prediabetes, BPH, and idiopathic peripheral neuropathy.   DIAGNOSTIC FINDINGS: MRI brain, 10/01/2022 ". No acute intracranial abnormality.  2. Generalized volume loss and findings of chronic small vessel ischemia."  Per Neurology note, "APOE genetic testing shows E3/E4 variant which is associated with a slight increase risk of development of late-onset Alzheimer's Disease compared to the general population. This lab does not diagnose of Alzheimer's Disease You had  ATN panel done. A - Beta Amyloid 42/40 ratio, T- Phosphorylated- Tau 181 N- Plasma Neurofilament Light Chain Protein Reduced Beta Amyloid 42/40 ratio, elevated p-Tau-181 has been associated with presence of Alzheimer's Disease pathology."  SLUMS 10/01/22, 15/30.   PAIN:  Are you having pain? No   FALLS: Has patient fallen in last 6 months?  Yes, including fall off of a step stool  LIVING ENVIRONMENT: Lives with: lives with their spouse Lives in: House/apartment; Independent living; moved there "a few weeks ago"  PLOF:  Level of assistance: Independent with ADLs, Needed assistance with IADLS Employment: Retired   PATIENT GOALS   to improve his "thinking"  OBJECTIVE:    TODAY'S TREATMENT:  Pt seen for skilled SLP services targeting functional memory.   Memory compensations: Pt did not bring calendar or journal to today's tx session. Pt stated he "thought of it when he was in the car." Reviewed rationale for calendar and journal. Pt reports utilizing both since last session. Reviewed importance of having "homes" for frequently lost items. Pt identified a bowl for his ILF ID, the nightstand for his keys, wallet, and eyeglasses and the bathroom for his hearing aides.   PROM:   MULTIFACTORIAL MEMORY QUESTIONNAIRE (MMQ)  Administered patient self-reported outcome measure Multifactorial Memory Questionnaire (MMQ). The Multifactorial Memory Questionnaire [facility]) consists of three scales measuring separate aspects of metamemory; Satisfaction, Ability and Strategy.   Pt's responses are converted to T-Scores with severity levels based on pt's T-Score.   Severity Levels (T-score) Very Low - < 20 Low - 20 to 29 Below  Average - 30-39 Average - 40 to 60 Above Average - 60 to 70 High - 71 to 80 Very High - > 80  Pt reports:  Average - 40 to 60 Memory Satisfaction (T-score: 54) Average - 40 to 60 Memory Ability (T-score: 56) Below Average - 30-39 use of Memory Strategies (T-score:  33)  *Noted improvement in both Memory Satisfaction and Use of Memory Strategies subtests.    Education:  Additional education completed re: ways to promote cognitive functioning as well as rationale for compensations as outlined above. Reviewed importance of routine, socialization, and physical activity in promoting cognition as well as rationale for scheduling activities at ILF.       PATIENT EDUCATION: Education details: as above Person educated: Patient;  Education method: Explanation Education comprehension: verbalized understanding and needs further education  HOME EXERCISE PROGRAM:        1) Continue to use calendar  2) Find "homes" for x2 commonly misplaced items  3) Start utilizing journal for pertinent events     GOALS:  Goals reviewed with patient? Yes  SHORT TERM GOALS: Target date: 10 sessions  Pt will participate in further dynamic/functional assessment of pt's ability to complete iADLs (e.g. medication management, balancing a checkbook). Baseline: Goal status: MET   2.  Pt and/or family member will ID x3 strategies to improve functional attention and memory.  Baseline:  Goal status: MET  3.  Patient will create daily to-do list 5/7 days.  Baseline:  Goal status: NOT MET; d/c goal; pt prefers to utilize calendar for action items  4.  Patient will develop memory system and remember to bring it to >75% of sessions.  Baseline:  Goal status: MET  5.  Pt will demonstrate compensatory strategies for anomia with min assistance during structured tasks. Baseline:  Goal status: MET  6.  Pt will report engagement in cognitive activities 2x week outside of SLP services. Baseline:  Goal status: IN PROGRESS  7. Pt will use journaling to improve recall of pertinent events with min A.    Baseline:  Goal status: new, 03/21/23  LONG TERM GOALS: Target date: 12 weeks  Patient report improved use of compensations as per improvement on PROM.  Baseline:  Goal  status: MET - 03/25/23  2.  Patient will demonstrate understanding of appropriate functional tasks/strategies for daily cognitive activities.  Baseline:  Goal status: IN PROGRESS     ASSESSMENT:  CLINICAL IMPRESSION:  Pt is 89 y.o. male who presents for cognitive-communication evaluation in setting of mild mixed dementia (Alzheimer's Disease, vascular). Pt presents with cognitive-linguistic deficits affecting attention, memory, executive functioning and higher level expressive communication. Pt with reduced insight into CLOF. Hearing likely impacting performance on initial assessment. Pt and daughter report that pt is still driving locally, manages his finances, and manages his medication. See tx details above. I recommend course of skilled ST for training in strategies to manage cognitive impairments in order to improve function and quality of life.   OBJECTIVE IMPAIRMENTS include attention, memory, awareness, executive functioning, and expressive language. These impairments are limiting patient from ADLs/IADLs and effectively communicating at home and in community. Factors affecting potential to achieve goals and functional outcome are ability to learn/carryover information, medical prognosis, and severity of impairments.. Patient will benefit from skilled SLP services to address above impairments and improve overall function.  REHAB POTENTIAL: Good  PLAN: SLP FREQUENCY: 1-2x/week  SLP DURATION: 12 weeks  PLANNED INTERVENTIONS: Environmental controls, Cueing hierachy, Cognitive reorganization, Internal/external aids, Functional tasks, SLP  instruction and feedback, Compensatory strategies, and Patient/family education    Clyde Canterbury, M.S., CCC-SLP Speech-Language Pathologist [place] [facility] [phone] Arnette Felts)  [place] [facility] Outpatient Rehabilitation at [facility] [address] [place], [place], [address] Phone: [phone]    Fax:  [phone]

## 2023-03-28 ENCOUNTER — Ambulatory Visit: Payer: PPO

## 2023-03-28 DIAGNOSIS — G3184 Mild cognitive impairment, so stated: Secondary | ICD-10-CM

## 2023-03-28 DIAGNOSIS — R41841 Cognitive communication deficit: Secondary | ICD-10-CM

## 2023-03-28 NOTE — Therapy (Signed)
OUTPATIENT SPEECH LANGUAGE PATHOLOGY  TREATMENT    Patient Name: Mark Cowan MRN: [id number] DOB:11-22-1933, 89 y.o., male Today's Date: 03/28/2023  PCP: Jerl Mina, MD REFERRING PROVIDER: Janice Coffin, PA-C   End of Session - 03/28/23 0947     Visit Number 13    Number of Visits 25    Date for SLP Re-Evaluation 04/23/23    Authorization Type [employer] P[employer]    Progress Note Due on Visit 20    SLP Start Time 0945    SLP Stop Time  1023    SLP Time Calculation (min) 38 min    Activity Tolerance Patient tolerated treatment well              Patient Active Problem List   Diagnosis Date Noted   Essential hypertension 11/16/2022   Non-sustained ventricular tachycardia (HCC) 11/16/2022   Fall from ladder 11/16/2022   Pre-diabetes 11/16/2022   S/P total knee arthroplasty 11/12/2016   Diverticulitis of colon (without mention of hemorrhage)(562.11) 11/04/2012   Screening for colon cancer 11/04/2012    ONSET DATE: 01/22/23 (referral date); cognitive decline since 2022  REFERRING DIAG: mild cognitive impairment  THERAPY DIAG:  Mild cognitive impairment  Cognitive communication deficit  Rationale for Evaluation and Treatment Rehabilitation  SUBJECTIVE:   SUBJECTIVE STATEMENT: Pt alert, pleasant, and cooperative. Apologetic for being late. Appeared tired.  Pt accompanied by: self;   PERTINENT HISTORY: Pt is an 89 y.o. male with hx of MCI in setting of mild mixed dementia. Pt with PMHx significant of hypertension, hyperlipidemia, prediabetes, BPH, and idiopathic peripheral neuropathy.   DIAGNOSTIC FINDINGS: MRI brain, 10/01/2022 ". No acute intracranial abnormality.  2. Generalized volume loss and findings of chronic small vessel ischemia."  Per Neurology note, "APOE genetic testing shows E3/E4 variant which is associated with a slight increase risk of development of late-onset Alzheimer's Disease compared to the general population. This lab does  not diagnose of Alzheimer's Disease You had ATN panel done. A - Beta Amyloid 42/40 ratio, T- Phosphorylated- Tau 181 N- Plasma Neurofilament Light Chain Protein Reduced Beta Amyloid 42/40 ratio, elevated p-Tau-181 has been associated with presence of Alzheimer's Disease pathology."  SLUMS 10/01/22, 15/30.   PAIN:  Are you having pain? No   FALLS: Has patient fallen in last 6 months?  Yes, including fall off of a step stool; fall reported on 12/12, "overnight" "going to the bathroom."  LIVING ENVIRONMENT: Lives with: lives with their spouse Lives in: House/apartment; Independent living; moved there "a few weeks ago"  PLOF:  Level of assistance: Independent with ADLs, Needed assistance with IADLS Employment: Retired   PATIENT GOALS   to improve his "thinking"  OBJECTIVE:    TODAY'S TREATMENT:   Pt reported falling "overnight" "on  the way to the bathroom." Endorsed that 2 nursing staff from ILF helped to get him off floor and assess him. Pt believes he fell on his L side and denies hitting his head. Pt stated that nursing staff did not feel he needed any further assessment. Pt endorsed feeling "tired from all of the excitement overnight." Pt appeared fatigued today.   Pt seen for skilled SLP services targeting functional memory.   Memory compensations: Pt brought calendar. SLP noted several instances where pt updated calendar with additional appointments outside of tx. Pt answered temporal orientation questions as well as questions re: upcoming appoinments with min cues while referring to calendar.   Pt did not bring journal to today's tx session. Pt left it a home.  Pt wrote reminder in calendar with mod A for remember to use journal for logging daily events. Reviewed rationale for calendar and journal. Pt declined using journal, but agreeable to "trying" it. Reviewed importance of having "homes" for frequently lost items. Pt identified a bowl for his ILF ID, the nightstand for his  keys, wallet, and eyeglasses and the bathroom for his hearing aides.      Education:  Additional education completed re: ways to promote cognitive functioning as well as rationale for compensations as outlined above. Reviewed importance of routine, socialization, and physical activity in promoting cognition as well as rationale for scheduling activities at ILF. Also, reviewed effect of lack of sleep on cognition as well as other factors that may impact cognition.      PATIENT EDUCATION: Education details: as above Person educated: Patient;  Education method: Explanation Education comprehension: verbalized understanding and needs further education  HOME EXERCISE PROGRAM:        1) Continue to use calendar  2) Find "homes" for x2 commonly misplaced items  3) Start utilizing journal for pertinent events     GOALS:  Goals reviewed with patient? Yes  SHORT TERM GOALS: Target date: 10 sessions  Pt will participate in further dynamic/functional assessment of pt's ability to complete iADLs (e.g. medication management, balancing a checkbook). Baseline: Goal status: MET   2.  Pt and/or family member will ID x3 strategies to improve functional attention and memory.  Baseline:  Goal status: MET  3.  Patient will create daily to-do list 5/7 days.  Baseline:  Goal status: NOT MET; d/c goal; pt prefers to utilize calendar for action items  4.  Patient will develop memory system and remember to bring it to >75% of sessions.  Baseline:  Goal status: MET  5.  Pt will demonstrate compensatory strategies for anomia with min assistance during structured tasks. Baseline:  Goal status: MET  6.  Pt will report engagement in cognitive activities 2x week outside of SLP services. Baseline:  Goal status: IN PROGRESS  7. Pt will use journaling to improve recall of pertinent events with min A.    Baseline:  Goal status: new, 03/21/23  LONG TERM GOALS: Target date: 12 weeks  Patient  report improved use of compensations as per improvement on PROM.  Baseline:  Goal status: MET - 03/25/23  2.  Patient will demonstrate understanding of appropriate functional tasks/strategies for daily cognitive activities.  Baseline:  Goal status: IN PROGRESS     ASSESSMENT:  CLINICAL IMPRESSION:  Pt is 89 y.o. male who presents for cognitive-communication evaluation in setting of mild mixed dementia (Alzheimer's Disease, vascular). Pt presents with cognitive-linguistic deficits affecting attention, memory, executive functioning and higher level expressive communication. Pt with reduced insight into CLOF. Hearing likely impacting performance on initial assessment. Pt and daughter report that pt is still driving locally, manages his finances, and manages his medication. See tx details above. I recommend course of skilled ST for training in strategies to manage cognitive impairments in order to improve function and quality of life.   OBJECTIVE IMPAIRMENTS include attention, memory, awareness, executive functioning, and expressive language. These impairments are limiting patient from ADLs/IADLs and effectively communicating at home and in community. Factors affecting potential to achieve goals and functional outcome are ability to learn/carryover information, medical prognosis, and severity of impairments.. Patient will benefit from skilled SLP services to address above impairments and improve overall function.  REHAB POTENTIAL: Good  PLAN: SLP FREQUENCY: 1-2x/week  SLP DURATION: 12 weeks  PLANNED INTERVENTIONS: Environmental controls, Cueing hierachy, Cognitive reorganization, Internal/external aids, Functional tasks, SLP instruction and feedback, Compensatory strategies, and Patient/family education    Clyde Canterbury, M.S., CCC-SLP Speech-Language Pathologist [place] - [facility] [phone] Arnette Felts)  [facility] [facility] at  [facility] [address] [place], [place], [address] Phone: [phone]   Fax:  [phone]

## 2023-04-01 ENCOUNTER — Ambulatory Visit: Payer: PPO

## 2023-04-01 DIAGNOSIS — G3184 Mild cognitive impairment, so stated: Secondary | ICD-10-CM

## 2023-04-01 DIAGNOSIS — R41841 Cognitive communication deficit: Secondary | ICD-10-CM

## 2023-04-01 NOTE — Therapy (Signed)
OUTPATIENT SPEECH LANGUAGE PATHOLOGY  TREATMENT    Patient Name: Mark Cowan MRN: [id number] DOB:1934/01/18, 89 y.o., male Today's Date: 04/01/2023  PCP: Jerl Mina, MD REFERRING PROVIDER: Janice Coffin, PA-C   End of Session - 04/01/23 1217     Visit Number 14    Number of Visits 25    Date for SLP Re-Evaluation 04/23/23    Authorization Type [employer]    Progress Note Due on Visit 20    SLP Start Time 1003    SLP Stop Time  1035    SLP Time Calculation (min) 32 min    Activity Tolerance Patient tolerated treatment well              Patient Active Problem List   Diagnosis Date Noted   Essential hypertension 11/16/2022   Non-sustained ventricular tachycardia (HCC) 11/16/2022   Fall from ladder 11/16/2022   Pre-diabetes 11/16/2022   S/P total knee arthroplasty 11/12/2016   Diverticulitis of colon (without mention of hemorrhage)(562.11) 11/04/2012   Screening for colon cancer 11/04/2012    ONSET DATE: 01/22/23 (referral date); cognitive decline since 2022  REFERRING DIAG: mild cognitive impairment  THERAPY DIAG:  Mild cognitive impairment  Cognitive communication deficit  Rationale for Evaluation and Treatment Rehabilitation  SUBJECTIVE:   SUBJECTIVE STATEMENT: Pt alert, pleasant, and cooperative. Apologetic for being late. Appeared tired.  Pt accompanied by: self;   PERTINENT HISTORY: Pt is an 89 y.o. male with hx of MCI in setting of mild mixed dementia. Pt with PMHx significant of hypertension, hyperlipidemia, prediabetes, BPH, and idiopathic peripheral neuropathy.   DIAGNOSTIC FINDINGS: MRI brain, 10/01/2022 ". No acute intracranial abnormality.  2. Generalized volume loss and findings of chronic small vessel ischemia."  Per Neurology note, "APOE genetic testing shows E3/E4 variant which is associated with a slight increase risk of development of late-onset Alzheimer's Disease compared to the general population. This lab does  not diagnose of Alzheimer's Disease You had ATN panel done. A - Beta Amyloid 42/40 ratio, T- Phosphorylated- Tau 181 N- Plasma Neurofilament Light Chain Protein Reduced Beta Amyloid 42/40 ratio, elevated p-Tau-181 has been associated with presence of Alzheimer's Disease pathology."  SLUMS 10/01/22, 15/30.   PAIN:  Are you having pain? No   FALLS: Has patient fallen in last 6 months?  Yes, including fall off of a step stool; fall reported on 12/12, "overnight" "going to the bathroom."  LIVING ENVIRONMENT: Lives with: lives with their spouse Lives in: House/apartment; Independent living; moved there "a few weeks ago"  PLOF:  Level of assistance: Independent with ADLs, Needed assistance with IADLS Employment: Retired   PATIENT GOALS   to improve his "thinking"  OBJECTIVE:    TODAY'S TREATMENT:    Pt seen for skilled SLP services targeting functional memory.   Memory compensations: Pt brought calendar. SLP noted several instances where pt updated calendar with additional appointments outside of tx. Pt answered temporal orientation questions as well as questions re: upcoming appoinments with min cues while referring to calendar.   Pt did not bring journal to today's tx session. Pt left it a home. Pt wrote reminder in calendar with mod A for remember to use journal for logging daily events. Reviewed rationale for calendar and journal. Pt declined using journal, but agreeable to "trying" it. Reviewed importance of having "homes" for frequently lost items. Pt identified a bowl for his ILF ID, the nightstand for his keys, wallet, and eyeglasses and the bathroom for his hearing aides.   Wordfinding: Noted  pt with x1 instance of anomia with indep repair during conversational exchanges. Reviewed anomia strategies as pt did not recall targeting them in previous sessions despite indep use of circumlocution this date. Pt participated in divergent naming tasks naming 5 items per concrete category  with min A for use of anomia strategies.       PATIENT EDUCATION: Education details: as above Person educated: Patient;  Education method: Explanation Education comprehension: verbalized understanding and needs further education  HOME EXERCISE PROGRAM:        1) Continue to use calendar  2) Find "homes" for x2 commonly misplaced items  3) Start utilizing journal for pertinent events     GOALS:  Goals reviewed with patient? Yes  SHORT TERM GOALS: Target date: 10 sessions  Pt will participate in further dynamic/functional assessment of pt's ability to complete iADLs (e.g. medication management, balancing a checkbook). Baseline: Goal status: MET   2.  Pt and/or family member will ID x3 strategies to improve functional attention and memory.  Baseline:  Goal status: MET  3.  Patient will create daily to-do list 5/7 days.  Baseline:  Goal status: NOT MET; d/c goal; pt prefers to utilize calendar for action items  4.  Patient will develop memory system and remember to bring it to >75% of sessions.  Baseline:  Goal status: MET  5.  Pt will demonstrate compensatory strategies for anomia with min assistance during structured tasks. Baseline:  Goal status: MET  6.  Pt will report engagement in cognitive activities 2x week outside of SLP services. Baseline:  Goal status: IN PROGRESS  7. Pt will use journaling to improve recall of pertinent events with min A.    Baseline:  Goal status: new, 03/21/23  LONG TERM GOALS: Target date: 12 weeks  Patient report improved use of compensations as per improvement on PROM.  Baseline:  Goal status: MET - 03/25/23  2.  Patient will demonstrate understanding of appropriate functional tasks/strategies for daily cognitive activities.  Baseline:  Goal status: IN PROGRESS     ASSESSMENT:  CLINICAL IMPRESSION:  Pt is 89 y.o. male who presents for cognitive-communication evaluation in setting of mild mixed dementia (Alzheimer's  Disease, vascular). Pt presents with cognitive-linguistic deficits affecting attention, memory, executive functioning and higher level expressive communication. Pt with reduced insight into CLOF. Hearing likely impacting performance on initial assessment. Pt and daughter report that pt is still driving locally, manages his finances, and manages his medication. See tx details above. I recommend course of skilled ST for training in strategies to manage cognitive impairments in order to improve function and quality of life.   OBJECTIVE IMPAIRMENTS include attention, memory, awareness, executive functioning, and expressive language. These impairments are limiting patient from ADLs/IADLs and effectively communicating at home and in community. Factors affecting potential to achieve goals and functional outcome are ability to learn/carryover information, medical prognosis, and severity of impairments.. Patient will benefit from skilled SLP services to address above impairments and improve overall function.  REHAB POTENTIAL: Good  PLAN: SLP FREQUENCY: 1-2x/week  SLP DURATION: 12 weeks  PLANNED INTERVENTIONS: Environmental controls, Cueing hierachy, Cognitive reorganization, Internal/external aids, Functional tasks, SLP instruction and feedback, Compensatory strategies, and Patient/family education    Clyde Canterbury, M.S., CCC-SLP Speech-Language Pathologist [place] - [facility] [phone] Arnette Felts)  [place] [facility] Out[facility] Rehabilitation at [facility] [address] [place], [place], [address] Phone: [phone]   Fax:  [phone]

## 2023-04-04 ENCOUNTER — Ambulatory Visit: Payer: PPO

## 2023-04-04 ENCOUNTER — Telehealth: Payer: Self-pay

## 2023-04-04 NOTE — Telephone Encounter (Signed)
Pt no showed for today's appointment. Contacted pt via preferred phone number. Wife answered and stated pt forgot about his appointment. Reminded pt/wife of next scheduled appointment on Monday, 12/23.   Clyde Canterbury, M.S., CCC-SLP Speech-Language Pathologist [place] [facility] [phone] (ASCOM)

## 2023-04-08 ENCOUNTER — Ambulatory Visit: Payer: PPO

## 2023-04-08 DIAGNOSIS — R41841 Cognitive communication deficit: Secondary | ICD-10-CM

## 2023-04-08 DIAGNOSIS — G3184 Mild cognitive impairment, so stated: Secondary | ICD-10-CM

## 2023-04-08 NOTE — Therapy (Signed)
OUTPATIENT SPEECH LANGUAGE PATHOLOGY  TREATMENT    Patient Name: Mark Cowan MRN: [id number] DOB:July 08, 1933, 89 y.o., male Today's Date: 04/08/2023  PCP: Jerl Mina, MD REFERRING PROVIDER: Janice Coffin, PA-C   End of Session - 04/08/23 1210     Visit Number 15    Number of Visits 25    Date for SLP Re-Evaluation 04/23/23    Authorization Type [employer]    Progress Note Due on Visit 20    SLP Start Time 1110    SLP Stop Time  1148    SLP Time Calculation (min) 38 min    Activity Tolerance Patient tolerated treatment well              Patient Active Problem List   Diagnosis Date Noted   Essential hypertension 11/16/2022   Non-sustained ventricular tachycardia (HCC) 11/16/2022   Fall from ladder 11/16/2022   Pre-diabetes 11/16/2022   S/P total knee arthroplasty 11/12/2016   Diverticulitis of colon (without mention of hemorrhage)(562.11) 11/04/2012   Screening for colon cancer 11/04/2012    ONSET DATE: 01/22/23 (referral date); cognitive decline since 2022  REFERRING DIAG: mild cognitive impairment  THERAPY DIAG:  Mild cognitive impairment  Cognitive communication deficit  Rationale for Evaluation and Treatment Rehabilitation  SUBJECTIVE:   SUBJECTIVE STATEMENT: Pt alert, pleasant, and cooperative.   Pt accompanied by: self;   PERTINENT HISTORY: Pt is an 89 y.o. male with hx of MCI in setting of mild mixed dementia. Pt with PMHx significant of hypertension, hyperlipidemia, prediabetes, BPH, and idiopathic peripheral neuropathy.   DIAGNOSTIC FINDINGS: MRI brain, 10/01/2022 ". No acute intracranial abnormality.  2. Generalized volume loss and findings of chronic small vessel ischemia."  Per Neurology note, "APOE genetic testing shows E3/E4 variant which is associated with a slight increase risk of development of late-onset Alzheimer's Disease compared to the general population. This lab does not diagnose of Alzheimer's Disease You had  ATN panel done. A - Beta Amyloid 42/40 ratio, T- Phosphorylated- Tau 181 N- Plasma Neurofilament Light Chain Protein Reduced Beta Amyloid 42/40 ratio, elevated p-Tau-181 has been associated with presence of Alzheimer's Disease pathology."  SLUMS 10/01/22, 15/30.   PAIN:  Are you having pain? No   FALLS: Has patient fallen in last 6 months?  Yes, including fall off of a step stool; fall reported on 12/12, "overnight" "going to the bathroom."  LIVING ENVIRONMENT: Lives with: lives with their spouse Lives in: House/apartment; Independent living; moved there "a few weeks ago"  PLOF:  Level of assistance: Independent with ADLs, Needed assistance with IADLS Employment: Retired   PATIENT GOALS   to improve his "thinking"  OBJECTIVE:    TODAY'S TREATMENT:    Pt seen for skilled SLP services targeting functional memory.   Memory compensations: Pt did not bring calendar.  Pt did not bring journal to today's tx session either and endorsed not knowing where it was. Pt provided with additional blank notebook. Pt wrote date, day, and simple instructions with min A. Pt wrote items from today's tx session with min A.  Reviewed rationale for calendar and journal. Asked pt if he planned to attempt using the journal, pt agreeable. Reviewed importance of having "homes" for frequently lost items. Pt identified a bowl for his ILF ID, the nightstand for his keys, wallet, and eyeglasses and the bathroom for his hearing aides.       PATIENT EDUCATION: Education details: as above Person educated: Patient;  Education method: Explanation Education comprehension: verbalized understanding and needs  further education  HOME EXERCISE PROGRAM:        1) Continue to use calendar  2) Find "homes" for x2 commonly misplaced items  3) Start utilizing journal for pertinent events     GOALS:  Goals reviewed with patient? Yes  SHORT TERM GOALS: Target date: 10 sessions  Pt will participate in further  dynamic/functional assessment of pt's ability to complete iADLs (e.g. medication management, balancing a checkbook). Baseline: Goal status: MET   2.  Pt and/or family member will ID x3 strategies to improve functional attention and memory.  Baseline:  Goal status: MET  3.  Patient will create daily to-do list 5/7 days.  Baseline:  Goal status: NOT MET; d/c goal; pt prefers to utilize calendar for action items  4.  Patient will develop memory system and remember to bring it to >75% of sessions.  Baseline:  Goal status: MET  5.  Pt will demonstrate compensatory strategies for anomia with min assistance during structured tasks. Baseline:  Goal status: MET  6.  Pt will report engagement in cognitive activities 2x week outside of SLP services. Baseline:  Goal status: IN PROGRESS  7. Pt will use journaling to improve recall of pertinent events with min A.    Baseline:  Goal status: new, 03/21/23  LONG TERM GOALS: Target date: 12 weeks  Patient report improved use of compensations as per improvement on PROM.  Baseline:  Goal status: MET - 03/25/23  2.  Patient will demonstrate understanding of appropriate functional tasks/strategies for daily cognitive activities.  Baseline:  Goal status: IN PROGRESS     ASSESSMENT:  CLINICAL IMPRESSION:  Pt is 89 y.o. male who presents for cognitive-communication evaluation in setting of mild mixed dementia (Alzheimer's Disease, vascular). Pt presents with cognitive-linguistic deficits affecting attention, memory, executive functioning and higher level expressive communication. Pt with reduced insight into CLOF. Hearing likely impacting performance on initial assessment. Pt and daughter report that pt is still driving locally, manages his finances, and manages his medication. See tx details above. I recommend course of skilled ST for training in strategies to manage cognitive impairments in order to improve function and quality of life.    OBJECTIVE IMPAIRMENTS include attention, memory, awareness, executive functioning, and expressive language. These impairments are limiting patient from ADLs/IADLs and effectively communicating at home and in community. Factors affecting potential to achieve goals and functional outcome are ability to learn/carryover information, medical prognosis, and severity of impairments.. Patient will benefit from skilled SLP services to address above impairments and improve overall function.  REHAB POTENTIAL: Good  PLAN: SLP FREQUENCY: 1-2x/week  SLP DURATION: 12 weeks  PLANNED INTERVENTIONS: Environmental controls, Cueing hierachy, Cognitive reorganization, Internal/external aids, Functional tasks, SLP instruction and feedback, Compensatory strategies, and Patient/family education    Clyde Canterbury, M.S., CCC-SLP Speech-Language Pathologist [place] - [facility] [phone] Arnette Felts)  [place] [facility] Outpatient Rehabilitation at [facility] [address] [place], [place], [address] Phone: [phone]   Fax:  [phone]

## 2023-04-11 ENCOUNTER — Ambulatory Visit: Payer: PPO

## 2023-04-11 DIAGNOSIS — G3184 Mild cognitive impairment, so stated: Secondary | ICD-10-CM | POA: Diagnosis not present

## 2023-04-11 DIAGNOSIS — R41841 Cognitive communication deficit: Secondary | ICD-10-CM

## 2023-04-11 NOTE — Therapy (Signed)
OUTPATIENT SPEECH LANGUAGE PATHOLOGY  TREATMENT    Patient Name: Mark Cowan MRN: [id number] DOB:Mar 20, 1934, 88 y.o., male Today's Date: 04/11/2023  PCP: Jerl Mina, MD REFERRING PROVIDER: Janice Coffin, PA-C   End of Session - 04/11/23 1136     Visit Number 16    Number of Visits 25    Date for SLP Re-Evaluation 04/23/23    Authorization Type [employer]    Progress Note Due on Visit 20    SLP Start Time 1107    SLP Stop Time  1145    SLP Time Calculation (min) 38 min    Activity Tolerance Patient tolerated treatment well              Patient Active Problem List   Diagnosis Date Noted   Essential hypertension 11/16/2022   Non-sustained ventricular tachycardia (HCC) 11/16/2022   Fall from ladder 11/16/2022   Pre-diabetes 11/16/2022   S/P total knee arthroplasty 11/12/2016   Diverticulitis of colon (without mention of hemorrhage)(562.11) 11/04/2012   Screening for colon cancer 11/04/2012    ONSET DATE: 01/22/23 (referral date); cognitive decline since 2022  REFERRING DIAG: mild cognitive impairment  THERAPY DIAG:  Mild cognitive impairment  Cognitive communication deficit  Rationale for Evaluation and Treatment Rehabilitation  SUBJECTIVE:   SUBJECTIVE STATEMENT: Pt alert, pleasant, and cooperative.   Pt accompanied by: self;   PERTINENT HISTORY: Pt is an 89 y.o. male with hx of MCI in setting of mild mixed dementia. Pt with PMHx significant of hypertension, hyperlipidemia, prediabetes, BPH, and idiopathic peripheral neuropathy.   DIAGNOSTIC FINDINGS: MRI brain, 10/01/2022 ". No acute intracranial abnormality.  2. Generalized volume loss and findings of chronic small vessel ischemia."  Per Neurology note, "APOE genetic testing shows E3/E4 variant which is associated with a slight increase risk of development of late-onset Alzheimer's Disease compared to the general population. This lab does not diagnose of Alzheimer's Disease You had  ATN panel done. A - Beta Amyloid 42/40 ratio, T- Phosphorylated- Tau 181 N- Plasma Neurofilament Light Chain Protein Reduced Beta Amyloid 42/40 ratio, elevated p-Tau-181 has been associated with presence of Alzheimer's Disease pathology."  SLUMS 10/01/22, 15/30.   PAIN:  Are you having pain? No   FALLS: Has patient fallen in last 6 months?  Yes, including fall off of a step stool; fall reported on 12/12, "overnight" "going to the bathroom."  LIVING ENVIRONMENT: Lives with: lives with their spouse Lives in: House/apartment; Independent living; moved there "a few weeks ago"  PLOF:  Level of assistance: Independent with ADLs, Needed assistance with IADLS Employment: Retired   PATIENT GOALS   to improve his "thinking"  OBJECTIVE:    TODAY'S TREATMENT:    Pt seen for skilled SLP services targeting functional memory and executive functioning.   Memory compensations: Pt brought calendar. Pt utilized to answer temporal orientation questions and recall timeline of past events with extra time; min A to write down needs for upcoming appointment (e.g. bring calendar, journal). Again, pt did not bring journal to today's tx session. Pt has been provided with x2 blank notebooks for use.  Reviewed rationale for calendar and journal. Asked pt if he planned to attempt using the journal, pt agreeable. Reviewed importance of having "homes" for frequently lost items. Pt identified a bowl for his ILF ID, the nightstand for his keys, wallet, and eyeglasses and the bathroom for his hearing aides.   Executive functioning: Pt sequenced 4-6 step ADLs, IADLs with 80% accuracy indep; min A for 100%.  PATIENT EDUCATION: Education details: as above Person educated: Patient;  Education method: Explanation Education comprehension: verbalized understanding and needs further education  HOME EXERCISE PROGRAM:        1) Continue to use calendar  2) Find "homes" for x2 commonly misplaced items  3)  Start utilizing journal for pertinent events     GOALS:  Goals reviewed with patient? Yes  SHORT TERM GOALS: Target date: 10 sessions  Pt will participate in further dynamic/functional assessment of pt's ability to complete iADLs (e.g. medication management, balancing a checkbook). Baseline: Goal status: MET   2.  Pt and/or family member will ID x3 strategies to improve functional attention and memory.  Baseline:  Goal status: MET  3.  Patient will create daily to-do list 5/7 days.  Baseline:  Goal status: NOT MET; d/c goal; pt prefers to utilize calendar for action items  4.  Patient will develop memory system and remember to bring it to >75% of sessions.  Baseline:  Goal status: MET  5.  Pt will demonstrate compensatory strategies for anomia with min assistance during structured tasks. Baseline:  Goal status: MET  6.  Pt will report engagement in cognitive activities 2x week outside of SLP services. Baseline:  Goal status: IN PROGRESS  7. Pt will use journaling to improve recall of pertinent events with min A.    Baseline:  Goal status: new, 03/21/23  LONG TERM GOALS: Target date: 12 weeks  Patient report improved use of compensations as per improvement on PROM.  Baseline:  Goal status: MET - 03/25/23  2.  Patient will demonstrate understanding of appropriate functional tasks/strategies for daily cognitive activities.  Baseline:  Goal status: IN PROGRESS     ASSESSMENT:  CLINICAL IMPRESSION:  Pt is 89 y.o. male who presents for cognitive-communication evaluation in setting of mild mixed dementia (Alzheimer's Disease, vascular). Pt presents with cognitive-linguistic deficits affecting attention, memory, executive functioning and higher level expressive communication. Pt with reduced insight into CLOF. Hearing likely impacting performance on initial assessment. Pt and daughter report that pt is still driving locally, manages his finances, and manages his  medication. See tx details above. I recommend course of skilled ST for training in strategies to manage cognitive impairments in order to improve function and quality of life.   OBJECTIVE IMPAIRMENTS include attention, memory, awareness, executive functioning, and expressive language. These impairments are limiting patient from ADLs/IADLs and effectively communicating at home and in community. Factors affecting potential to achieve goals and functional outcome are ability to learn/carryover information, medical prognosis, and severity of impairments.. Patient will benefit from skilled SLP services to address above impairments and improve overall function.  REHAB POTENTIAL: Good  PLAN: SLP FREQUENCY: 1-2x/week  SLP DURATION: 12 weeks  PLANNED INTERVENTIONS: Environmental controls, Cueing hierachy, Cognitive reorganization, Internal/external aids, Functional tasks, SLP instruction and feedback, Compensatory strategies, and Patient/family education    Clyde Canterbury, M.S., CCC-SLP Speech-Language Pathologist [facility] [phone] Arnette Felts)  [facility] [address] [place], [place], [address] Phone: [phone]   Fax:  [phone]

## 2023-04-22 ENCOUNTER — Ambulatory Visit: Payer: PPO | Attending: Student

## 2023-04-22 ENCOUNTER — Telehealth: Payer: Self-pay

## 2023-04-22 NOTE — Telephone Encounter (Signed)
 Pt no showed for his appointment scheduled for today at 1100. Pt stated that it slipped his mind with the weather. Reminded pt of next scheduled appointment on 1/8 at 1100.  Delon Bangs, M.S., CCC-SLP Speech-Language Pathologist [facility] [phone] (ASCOM)

## 2023-04-24 ENCOUNTER — Telehealth: Payer: Self-pay

## 2023-04-24 ENCOUNTER — Ambulatory Visit: Payer: PPO

## 2023-04-24 NOTE — Telephone Encounter (Signed)
 Pt no showed for today's appointment. This was also pt's last appointment scheduled per established POC. Contacted pt via telephone and left voicemail re: discharge from ST services. Should pt wish to resume ST, a physician's referral would be needed.   Delon Bangs, M.S., CCC-SLP Speech-Language Pathologist [facility] [phone] (ASCOM)

## 2023-04-30 ENCOUNTER — Telehealth: Payer: Self-pay

## 2023-04-30 DIAGNOSIS — I4729 Other ventricular tachycardia: Secondary | ICD-10-CM | POA: Diagnosis not present

## 2023-04-30 DIAGNOSIS — E782 Mixed hyperlipidemia: Secondary | ICD-10-CM | POA: Diagnosis not present

## 2023-04-30 DIAGNOSIS — Z23 Encounter for immunization: Secondary | ICD-10-CM | POA: Diagnosis not present

## 2023-04-30 DIAGNOSIS — I10 Essential (primary) hypertension: Secondary | ICD-10-CM | POA: Diagnosis not present

## 2023-04-30 NOTE — Telephone Encounter (Signed)
 Per front desk, pt and daughter showed up for an unscheduled appointment.   Called pt's preferred number and spoke with wife. Wife made aware of pt's recent d/c from ST services on 1/8 as well as the information below. Pt no showed to appointments on 1/6 and 1/8. 1/8 was pt's last scheduled appointment per established POC. Voicemail left on pt's cell phone on 1/8 noting that pt would be d/c'd from ST and if he would like to resume, a new ST order would be needed.   Wife stated she would let pt know.  Delon Bangs, M.S., CCC-SLP Speech-Language Pathologist [facility] [phone] (ASCOM)

## 2023-04-30 NOTE — Telephone Encounter (Signed)
 Returned phone call from Markavious, Micco' daughter. Discussed rationale for recent d/c from therapy as well as concern for pt's inability to maintain appointments and carryout tx tasks at home. Pt may benefit from HH (vs OP) ST services. Pt to f/u with Neurology next week. Pt's daughter stated she would discuss with patient and provider  Delon Bangs, M.S., CCC-SLP Speech-Language Pathologist [facility] [phone] [place])

## 2023-05-01 DIAGNOSIS — Z961 Presence of intraocular lens: Secondary | ICD-10-CM | POA: Diagnosis not present

## 2023-05-01 DIAGNOSIS — H52203 Unspecified astigmatism, bilateral: Secondary | ICD-10-CM | POA: Diagnosis not present

## 2023-05-07 DIAGNOSIS — W19XXXD Unspecified fall, subsequent encounter: Secondary | ICD-10-CM | POA: Diagnosis not present

## 2023-05-07 DIAGNOSIS — H9193 Unspecified hearing loss, bilateral: Secondary | ICD-10-CM | POA: Diagnosis not present

## 2023-05-07 DIAGNOSIS — G3184 Mild cognitive impairment, so stated: Secondary | ICD-10-CM | POA: Diagnosis not present

## 2023-05-07 DIAGNOSIS — R001 Bradycardia, unspecified: Secondary | ICD-10-CM | POA: Diagnosis not present

## 2023-05-13 DIAGNOSIS — R2689 Other abnormalities of gait and mobility: Secondary | ICD-10-CM | POA: Diagnosis not present

## 2023-05-13 DIAGNOSIS — R2681 Unsteadiness on feet: Secondary | ICD-10-CM | POA: Diagnosis not present

## 2023-05-16 DIAGNOSIS — R2681 Unsteadiness on feet: Secondary | ICD-10-CM | POA: Diagnosis not present

## 2023-05-16 DIAGNOSIS — R2689 Other abnormalities of gait and mobility: Secondary | ICD-10-CM | POA: Diagnosis not present

## 2023-05-17 DIAGNOSIS — R4181 Age-related cognitive decline: Secondary | ICD-10-CM | POA: Diagnosis not present

## 2023-05-17 DIAGNOSIS — R41841 Cognitive communication deficit: Secondary | ICD-10-CM | POA: Diagnosis not present

## 2023-05-20 DIAGNOSIS — R4181 Age-related cognitive decline: Secondary | ICD-10-CM | POA: Diagnosis not present

## 2023-05-20 DIAGNOSIS — R41841 Cognitive communication deficit: Secondary | ICD-10-CM | POA: Diagnosis not present

## 2023-05-21 DIAGNOSIS — R2681 Unsteadiness on feet: Secondary | ICD-10-CM | POA: Diagnosis not present

## 2023-05-21 DIAGNOSIS — R2689 Other abnormalities of gait and mobility: Secondary | ICD-10-CM | POA: Diagnosis not present

## 2023-05-22 DIAGNOSIS — R4181 Age-related cognitive decline: Secondary | ICD-10-CM | POA: Diagnosis not present

## 2023-05-22 DIAGNOSIS — R41841 Cognitive communication deficit: Secondary | ICD-10-CM | POA: Diagnosis not present

## 2023-05-23 DIAGNOSIS — R2689 Other abnormalities of gait and mobility: Secondary | ICD-10-CM | POA: Diagnosis not present

## 2023-05-23 DIAGNOSIS — R2681 Unsteadiness on feet: Secondary | ICD-10-CM | POA: Diagnosis not present

## 2023-05-27 DIAGNOSIS — R2689 Other abnormalities of gait and mobility: Secondary | ICD-10-CM | POA: Diagnosis not present

## 2023-05-27 DIAGNOSIS — R2681 Unsteadiness on feet: Secondary | ICD-10-CM | POA: Diagnosis not present

## 2023-05-29 DIAGNOSIS — R41841 Cognitive communication deficit: Secondary | ICD-10-CM | POA: Diagnosis not present

## 2023-05-29 DIAGNOSIS — R4181 Age-related cognitive decline: Secondary | ICD-10-CM | POA: Diagnosis not present

## 2023-05-30 DIAGNOSIS — R2689 Other abnormalities of gait and mobility: Secondary | ICD-10-CM | POA: Diagnosis not present

## 2023-05-30 DIAGNOSIS — R2681 Unsteadiness on feet: Secondary | ICD-10-CM | POA: Diagnosis not present

## 2023-05-31 DIAGNOSIS — R4181 Age-related cognitive decline: Secondary | ICD-10-CM | POA: Diagnosis not present

## 2023-05-31 DIAGNOSIS — R41841 Cognitive communication deficit: Secondary | ICD-10-CM | POA: Diagnosis not present

## 2023-06-03 DIAGNOSIS — R4181 Age-related cognitive decline: Secondary | ICD-10-CM | POA: Diagnosis not present

## 2023-06-03 DIAGNOSIS — R41841 Cognitive communication deficit: Secondary | ICD-10-CM | POA: Diagnosis not present

## 2023-06-05 DIAGNOSIS — R2689 Other abnormalities of gait and mobility: Secondary | ICD-10-CM | POA: Diagnosis not present

## 2023-06-05 DIAGNOSIS — R2681 Unsteadiness on feet: Secondary | ICD-10-CM | POA: Diagnosis not present

## 2023-06-07 DIAGNOSIS — R4181 Age-related cognitive decline: Secondary | ICD-10-CM | POA: Diagnosis not present

## 2023-06-07 DIAGNOSIS — R2681 Unsteadiness on feet: Secondary | ICD-10-CM | POA: Diagnosis not present

## 2023-06-07 DIAGNOSIS — R2689 Other abnormalities of gait and mobility: Secondary | ICD-10-CM | POA: Diagnosis not present

## 2023-06-07 DIAGNOSIS — R41841 Cognitive communication deficit: Secondary | ICD-10-CM | POA: Diagnosis not present

## 2023-06-11 DIAGNOSIS — R2681 Unsteadiness on feet: Secondary | ICD-10-CM | POA: Diagnosis not present

## 2023-06-11 DIAGNOSIS — R2689 Other abnormalities of gait and mobility: Secondary | ICD-10-CM | POA: Diagnosis not present

## 2023-06-12 DIAGNOSIS — R41841 Cognitive communication deficit: Secondary | ICD-10-CM | POA: Diagnosis not present

## 2023-06-12 DIAGNOSIS — R4181 Age-related cognitive decline: Secondary | ICD-10-CM | POA: Diagnosis not present

## 2023-06-13 DIAGNOSIS — R2681 Unsteadiness on feet: Secondary | ICD-10-CM | POA: Diagnosis not present

## 2023-06-13 DIAGNOSIS — R2689 Other abnormalities of gait and mobility: Secondary | ICD-10-CM | POA: Diagnosis not present

## 2023-06-14 DIAGNOSIS — R41841 Cognitive communication deficit: Secondary | ICD-10-CM | POA: Diagnosis not present

## 2023-06-14 DIAGNOSIS — R4181 Age-related cognitive decline: Secondary | ICD-10-CM | POA: Diagnosis not present

## 2023-06-19 DIAGNOSIS — R41841 Cognitive communication deficit: Secondary | ICD-10-CM | POA: Diagnosis not present

## 2023-06-19 DIAGNOSIS — R4181 Age-related cognitive decline: Secondary | ICD-10-CM | POA: Diagnosis not present

## 2023-06-21 DIAGNOSIS — R41841 Cognitive communication deficit: Secondary | ICD-10-CM | POA: Diagnosis not present

## 2023-06-21 DIAGNOSIS — R4181 Age-related cognitive decline: Secondary | ICD-10-CM | POA: Diagnosis not present

## 2023-06-24 DIAGNOSIS — R2689 Other abnormalities of gait and mobility: Secondary | ICD-10-CM | POA: Diagnosis not present

## 2023-06-24 DIAGNOSIS — R2681 Unsteadiness on feet: Secondary | ICD-10-CM | POA: Diagnosis not present

## 2023-06-26 DIAGNOSIS — R4181 Age-related cognitive decline: Secondary | ICD-10-CM | POA: Diagnosis not present

## 2023-06-26 DIAGNOSIS — R41841 Cognitive communication deficit: Secondary | ICD-10-CM | POA: Diagnosis not present

## 2023-06-28 DIAGNOSIS — R4181 Age-related cognitive decline: Secondary | ICD-10-CM | POA: Diagnosis not present

## 2023-06-28 DIAGNOSIS — R41841 Cognitive communication deficit: Secondary | ICD-10-CM | POA: Diagnosis not present

## 2023-07-01 DIAGNOSIS — R4181 Age-related cognitive decline: Secondary | ICD-10-CM | POA: Diagnosis not present

## 2023-07-01 DIAGNOSIS — R41841 Cognitive communication deficit: Secondary | ICD-10-CM | POA: Diagnosis not present

## 2023-07-03 DIAGNOSIS — R41841 Cognitive communication deficit: Secondary | ICD-10-CM | POA: Diagnosis not present

## 2023-07-03 DIAGNOSIS — R4181 Age-related cognitive decline: Secondary | ICD-10-CM | POA: Diagnosis not present

## 2023-07-05 DIAGNOSIS — R2681 Unsteadiness on feet: Secondary | ICD-10-CM | POA: Diagnosis not present

## 2023-07-05 DIAGNOSIS — R2689 Other abnormalities of gait and mobility: Secondary | ICD-10-CM | POA: Diagnosis not present

## 2023-07-08 DIAGNOSIS — R41841 Cognitive communication deficit: Secondary | ICD-10-CM | POA: Diagnosis not present

## 2023-07-08 DIAGNOSIS — R4181 Age-related cognitive decline: Secondary | ICD-10-CM | POA: Diagnosis not present

## 2023-07-12 DIAGNOSIS — R2681 Unsteadiness on feet: Secondary | ICD-10-CM | POA: Diagnosis not present

## 2023-07-12 DIAGNOSIS — R2689 Other abnormalities of gait and mobility: Secondary | ICD-10-CM | POA: Diagnosis not present

## 2023-07-15 DIAGNOSIS — R41841 Cognitive communication deficit: Secondary | ICD-10-CM | POA: Diagnosis not present

## 2023-07-15 DIAGNOSIS — R4181 Age-related cognitive decline: Secondary | ICD-10-CM | POA: Diagnosis not present

## 2023-07-17 DIAGNOSIS — R41841 Cognitive communication deficit: Secondary | ICD-10-CM | POA: Diagnosis not present

## 2023-07-17 DIAGNOSIS — R4181 Age-related cognitive decline: Secondary | ICD-10-CM | POA: Diagnosis not present

## 2023-07-19 DIAGNOSIS — R2681 Unsteadiness on feet: Secondary | ICD-10-CM | POA: Diagnosis not present

## 2023-07-19 DIAGNOSIS — R2689 Other abnormalities of gait and mobility: Secondary | ICD-10-CM | POA: Diagnosis not present

## 2023-07-22 DIAGNOSIS — Z85820 Personal history of malignant melanoma of skin: Secondary | ICD-10-CM | POA: Diagnosis not present

## 2023-07-22 DIAGNOSIS — L821 Other seborrheic keratosis: Secondary | ICD-10-CM | POA: Diagnosis not present

## 2023-07-22 DIAGNOSIS — L918 Other hypertrophic disorders of the skin: Secondary | ICD-10-CM | POA: Diagnosis not present

## 2023-07-22 DIAGNOSIS — D1722 Benign lipomatous neoplasm of skin and subcutaneous tissue of left arm: Secondary | ICD-10-CM | POA: Diagnosis not present

## 2023-07-22 DIAGNOSIS — D224 Melanocytic nevi of scalp and neck: Secondary | ICD-10-CM | POA: Diagnosis not present

## 2023-07-22 DIAGNOSIS — D1801 Hemangioma of skin and subcutaneous tissue: Secondary | ICD-10-CM | POA: Diagnosis not present

## 2023-07-22 DIAGNOSIS — Z85828 Personal history of other malignant neoplasm of skin: Secondary | ICD-10-CM | POA: Diagnosis not present

## 2023-07-22 DIAGNOSIS — D3617 Benign neoplasm of peripheral nerves and autonomic nervous system of trunk, unspecified: Secondary | ICD-10-CM | POA: Diagnosis not present

## 2023-07-22 DIAGNOSIS — L570 Actinic keratosis: Secondary | ICD-10-CM | POA: Diagnosis not present

## 2023-07-22 DIAGNOSIS — D225 Melanocytic nevi of trunk: Secondary | ICD-10-CM | POA: Diagnosis not present

## 2023-07-22 DIAGNOSIS — D2262 Melanocytic nevi of left upper limb, including shoulder: Secondary | ICD-10-CM | POA: Diagnosis not present

## 2023-07-22 DIAGNOSIS — L814 Other melanin hyperpigmentation: Secondary | ICD-10-CM | POA: Diagnosis not present

## 2023-07-23 DIAGNOSIS — R2681 Unsteadiness on feet: Secondary | ICD-10-CM | POA: Diagnosis not present

## 2023-07-23 DIAGNOSIS — R2689 Other abnormalities of gait and mobility: Secondary | ICD-10-CM | POA: Diagnosis not present

## 2023-07-24 DIAGNOSIS — R41841 Cognitive communication deficit: Secondary | ICD-10-CM | POA: Diagnosis not present

## 2023-07-24 DIAGNOSIS — R4181 Age-related cognitive decline: Secondary | ICD-10-CM | POA: Diagnosis not present

## 2023-07-29 DIAGNOSIS — R4181 Age-related cognitive decline: Secondary | ICD-10-CM | POA: Diagnosis not present

## 2023-07-29 DIAGNOSIS — R41841 Cognitive communication deficit: Secondary | ICD-10-CM | POA: Diagnosis not present

## 2023-07-31 DIAGNOSIS — R4181 Age-related cognitive decline: Secondary | ICD-10-CM | POA: Diagnosis not present

## 2023-07-31 DIAGNOSIS — R41841 Cognitive communication deficit: Secondary | ICD-10-CM | POA: Diagnosis not present

## 2023-09-18 ENCOUNTER — Other Ambulatory Visit: Payer: Self-pay

## 2023-09-20 MED ORDER — DUTASTERIDE 0.5 MG PO CAPS: 0.5000 mg | ORAL_CAPSULE | Freq: Every day | ORAL | 5 refills | Status: DC

## 2024-02-21 ENCOUNTER — Encounter: Payer: Self-pay | Admitting: Urology

## 2024-02-21 ENCOUNTER — Ambulatory Visit: Payer: Self-pay | Admitting: Urology

## 2024-02-21 VITALS — BP 152/75 | HR 69 | Ht 70.0 in | Wt 165.0 lb

## 2024-02-21 DIAGNOSIS — R8281 Pyuria: Secondary | ICD-10-CM | POA: Diagnosis not present

## 2024-02-21 DIAGNOSIS — N401 Enlarged prostate with lower urinary tract symptoms: Secondary | ICD-10-CM

## 2024-02-21 LAB — URINALYSIS, COMPLETE
Bilirubin, UA: NEGATIVE
Glucose, UA: NEGATIVE
Ketones, UA: NEGATIVE
Nitrite, UA: NEGATIVE
Protein,UA: NEGATIVE
RBC, UA: NEGATIVE
Specific Gravity, UA: 1.02 (ref 1.005–1.030)
Urobilinogen, Ur: 0.2 mg/dL (ref 0.2–1.0)
pH, UA: 6 (ref 5.0–7.5)

## 2024-02-21 LAB — MICROSCOPIC EXAMINATION: RBC, Urine: NONE SEEN /HPF (ref 0–2)

## 2024-02-21 MED ORDER — DUTASTERIDE 0.5 MG PO CAPS: 0.5000 mg | ORAL_CAPSULE | Freq: Every day | ORAL | 5 refills | Status: AC

## 2024-02-21 MED ORDER — TAMSULOSIN HCL 0.4 MG PO CAPS: 0.4000 mg | ORAL_CAPSULE | Freq: Every evening | ORAL | 11 refills | Status: AC

## 2024-02-21 NOTE — Progress Notes (Signed)
 02/21/2024 10:18 AM   Mark Cowan 1934-02-03 [id number]  Referring provider: Valora Lynwood FALCON, MD [address] [place],  [place] [address]  Chief Complaint  Patient presents with   Follow-up   Urologic history:  1.  BPH with LUTS Prior Mark Cowan patient on tamsulosin /dutasteride   HPI: Mark Cowan is a 89 y.o. male presents for annual follow-up.  His daughter was with him today  Initially seen 02/21/2023; followed by Dr. [name] for several years with BPH on tamsulosin /dutasteride  No complaints since last year's visit No bothersome LUTS Denies dysuria, gross hematuria No flank, abdominal or pelvic pain   PMH: Past Medical History:  Diagnosis Date   Arthritis    Diverticulosis    last attack 04/2011   Elevated lipids    Hypertension    dr  signe   hedrick    [place]   Neuropathy    Peripheral neuropathy    Prostate enlargement     Surgical History: Past Surgical History:  Procedure Laterality Date   CARDIAC CATHETERIZATION     87     stress test   56   COLON SURGERY     part of colon removed,diseased   HERNIA REPAIR     KNEE ARTHROPLASTY Left 11/12/2016   Procedure: COMPUTER ASSISTED TOTAL KNEE ARTHROPLASTY;  Surgeon: Mardee Lynwood SQUIBB, MD;  Location: [facility] ORS;  Service: Orthopedics;  Laterality: Left;   LUMBAR LAMINECTOMY/DECOMPRESSION MICRODISCECTOMY  07/26/2011   Procedure: LUMBAR LAMINECTOMY/DECOMPRESSION MICRODISCECTOMY 2 LEVELS;  Surgeon: [name], MD;  Location: [facility];  Service: Neurosurgery;  Laterality: Bilateral;  Lumbar two to four Laminectomies    MELANOMA EXCISION     lt arm   orthrocsopy  lt knee     TONSILLECTOMY      Home Medications:  Allergies as of 02/21/2024       Reactions   Statins Other (See Comments)   Muscle pain         Medication List        Accurate as of February 21, 2024 10:18 AM. If you have any questions, ask your nurse or doctor.          amLODipine  2.5 MG  tablet Commonly known as: NORVASC  Take 2.5 mg by mouth daily.   ascorbic acid  500 MG tablet Commonly known as: VITAMIN C  Take 500 mg by mouth daily.   aspirin  EC 81 MG tablet Take 81 mg by mouth daily.   CENTRUM SILVER 50+MEN PO Take by mouth.   Co Q 10 100 MG Caps Take 100 mg by mouth daily.   docusate sodium  100 MG capsule Commonly known as: COLACE Take 100 mg by mouth every evening.   dutasteride  0.5 MG capsule Commonly known as: AVODART  Take 1 capsule (0.5 mg total) by mouth daily.   fenofibrate  160 MG tablet Take 160 mg by mouth every evening.   fish oil-omega-3 fatty acids 1000 MG capsule Take 1 g by mouth 2 (two) times daily.   gabapentin  300 MG capsule Commonly known as: NEURONTIN  Take 300 mg by mouth every evening.   GLUCOSAMINE 1500 COMPLEX PO Take 1,500 mg by mouth 2 (two) times daily.   losartan -hydrochlorothiazide  100-25 MG tablet Commonly known as: HYZAAR Take 1 tablet by mouth daily.   Lysine 500 MG Caps Take 500 mg by mouth daily.   memantine  5 MG tablet Commonly known as: NAMENDA  Take 5 mg by mouth 2 (two) times daily.   rosuvastatin  10 MG tablet Commonly  known as: CRESTOR  Take 10 mg by mouth 2 (two) times a week. Tuesday and Friday   tamsulosin  0.4 MG Caps capsule Commonly known as: FLOMAX  Take 0.4 mg by mouth every evening.   Vitamin B-12 2500 MCG Subl Take 2,500 mcg by mouth daily.   Vitamin D3 50 MCG (2000 UT) Tabs Take 2,000 Units by mouth daily.        Allergies:  Allergies  Allergen Reactions   Statins Other (See Comments)    Muscle pain     Family History: Family History  Problem Relation Age of Onset   Diverticulosis Father        Multiple myloma    Social History:  reports that he quit smoking about 25 years ago. His smoking use included cigarettes. He has never used smokeless tobacco. He reports current alcohol use of about 7.0 standard drinks of alcohol per week. He reports that he does not use  drugs.   Physical Exam: BP (!) 152/75   Pulse 69   Ht 5' 10 (1.778 m)   Wt 165 lb (74.8 kg)   BMI 23.68 kg/m   Constitutional:  Alert, No acute distress. HEENT: [place] AT Respiratory: Normal respiratory effort, no increased work of breathing. Psychiatric: Normal mood and affect.  Laboratory Data:  Urinalysis Dipstick 1+ leukocytes Microscopy 6-10 WBC  Assessment & Plan:    1. Benign prostatic hyperplasia with LUTS Stable Tamsulosin /dutasteride  refilled UA today with mild pyuria; urine culture ordered Continue annual follow-up   [name], MD  [facility] [address] [place], [place] [address] [phone]

## 2024-02-24 LAB — CULTURE, URINE COMPREHENSIVE

## 2024-02-25 ENCOUNTER — Ambulatory Visit: Payer: Self-pay | Admitting: Urology

## 2025-02-19 ENCOUNTER — Ambulatory Visit: Admitting: Urology
